# Patient Record
Sex: Female | Born: 1945 | Race: Black or African American | Hispanic: No | Smoking: Former smoker
Health system: Southern US, Community
[De-identification: ages and names within clinical notes are randomized; demographics above are authoritative.]

## PROBLEM LIST (undated history)

## (undated) DIAGNOSIS — M109 Gout, unspecified: Secondary | ICD-10-CM

## (undated) DIAGNOSIS — I10 Essential (primary) hypertension: Secondary | ICD-10-CM

## (undated) DIAGNOSIS — M1990 Unspecified osteoarthritis, unspecified site: Secondary | ICD-10-CM

---

## 1992-09-26 HISTORY — PX: BREAST EXCISIONAL BIOPSY: SUR124

## 2001-11-16 ENCOUNTER — Encounter: Payer: Self-pay | Admitting: Family Medicine

## 2001-11-16 ENCOUNTER — Ambulatory Visit (HOSPITAL_COMMUNITY): Admission: RE | Admit: 2001-11-16 | Discharge: 2001-11-16 | Payer: Self-pay | Admitting: Family Medicine

## 2002-02-28 ENCOUNTER — Emergency Department (HOSPITAL_COMMUNITY): Admission: EM | Admit: 2002-02-28 | Discharge: 2002-02-28 | Payer: Self-pay | Admitting: Emergency Medicine

## 2002-02-28 ENCOUNTER — Encounter: Payer: Self-pay | Admitting: Emergency Medicine

## 2002-10-15 ENCOUNTER — Emergency Department (HOSPITAL_COMMUNITY): Admission: EM | Admit: 2002-10-15 | Discharge: 2002-10-15 | Payer: Self-pay | Admitting: Emergency Medicine

## 2002-10-15 ENCOUNTER — Encounter: Payer: Self-pay | Admitting: Emergency Medicine

## 2002-11-18 ENCOUNTER — Ambulatory Visit (HOSPITAL_COMMUNITY): Admission: RE | Admit: 2002-11-18 | Discharge: 2002-11-18 | Payer: Self-pay | Admitting: Family Medicine

## 2004-01-23 ENCOUNTER — Emergency Department (HOSPITAL_COMMUNITY): Admission: EM | Admit: 2004-01-23 | Discharge: 2004-01-23 | Payer: Self-pay | Admitting: *Deleted

## 2004-03-23 ENCOUNTER — Emergency Department (HOSPITAL_COMMUNITY): Admission: EM | Admit: 2004-03-23 | Discharge: 2004-03-23 | Payer: Self-pay | Admitting: Family Medicine

## 2004-03-29 ENCOUNTER — Emergency Department (HOSPITAL_COMMUNITY): Admission: EM | Admit: 2004-03-29 | Discharge: 2004-03-29 | Payer: Self-pay | Admitting: Emergency Medicine

## 2005-04-23 ENCOUNTER — Emergency Department (HOSPITAL_COMMUNITY): Admission: EM | Admit: 2005-04-23 | Discharge: 2005-04-24 | Payer: Self-pay | Admitting: *Deleted

## 2005-06-12 ENCOUNTER — Emergency Department (HOSPITAL_COMMUNITY): Admission: EM | Admit: 2005-06-12 | Discharge: 2005-06-12 | Payer: Self-pay | Admitting: Emergency Medicine

## 2005-11-09 ENCOUNTER — Ambulatory Visit (HOSPITAL_COMMUNITY): Admission: RE | Admit: 2005-11-09 | Discharge: 2005-11-09 | Payer: Self-pay | Admitting: Family Medicine

## 2006-07-25 ENCOUNTER — Emergency Department (HOSPITAL_COMMUNITY): Admission: EM | Admit: 2006-07-25 | Discharge: 2006-07-26 | Payer: Self-pay | Admitting: Emergency Medicine

## 2006-07-31 ENCOUNTER — Ambulatory Visit: Payer: Self-pay | Admitting: Orthopedic Surgery

## 2007-01-30 ENCOUNTER — Emergency Department (HOSPITAL_COMMUNITY): Admission: EM | Admit: 2007-01-30 | Discharge: 2007-01-30 | Payer: Self-pay | Admitting: Emergency Medicine

## 2008-01-31 ENCOUNTER — Ambulatory Visit (HOSPITAL_COMMUNITY): Admission: RE | Admit: 2008-01-31 | Discharge: 2008-01-31 | Payer: Self-pay | Admitting: Family Medicine

## 2009-01-16 ENCOUNTER — Emergency Department (HOSPITAL_COMMUNITY): Admission: EM | Admit: 2009-01-16 | Discharge: 2009-01-16 | Payer: Self-pay | Admitting: Emergency Medicine

## 2011-01-05 LAB — URINALYSIS, ROUTINE W REFLEX MICROSCOPIC
Bilirubin Urine: NEGATIVE
Glucose, UA: NEGATIVE mg/dL
Hgb urine dipstick: NEGATIVE
Nitrite: NEGATIVE
Specific Gravity, Urine: 1.02 (ref 1.005–1.030)
Urobilinogen, UA: 0.2 mg/dL (ref 0.0–1.0)
pH: 5 (ref 5.0–8.0)

## 2011-02-16 ENCOUNTER — Emergency Department (HOSPITAL_COMMUNITY)
Admission: EM | Admit: 2011-02-16 | Discharge: 2011-02-17 | Disposition: A | Discharge status: Home / Self Care | Payer: Medicare Other | Attending: Emergency Medicine | Admitting: Emergency Medicine

## 2011-02-16 DIAGNOSIS — M109 Gout, unspecified: Secondary | ICD-10-CM | POA: Insufficient documentation

## 2011-02-16 DIAGNOSIS — I10 Essential (primary) hypertension: Secondary | ICD-10-CM | POA: Insufficient documentation

## 2011-02-16 DIAGNOSIS — E039 Hypothyroidism, unspecified: Secondary | ICD-10-CM | POA: Insufficient documentation

## 2011-02-16 DIAGNOSIS — M25579 Pain in unspecified ankle and joints of unspecified foot: Secondary | ICD-10-CM | POA: Insufficient documentation

## 2011-02-16 DIAGNOSIS — E119 Type 2 diabetes mellitus without complications: Secondary | ICD-10-CM | POA: Insufficient documentation

## 2011-07-02 ENCOUNTER — Emergency Department (HOSPITAL_COMMUNITY): Payer: Medicare Other

## 2011-07-02 ENCOUNTER — Emergency Department (HOSPITAL_COMMUNITY)
Admission: EM | Admit: 2011-07-02 | Discharge: 2011-07-02 | Disposition: A | Discharge status: Home / Self Care | Payer: Medicare Other | Attending: Emergency Medicine | Admitting: Emergency Medicine

## 2011-07-02 DIAGNOSIS — R209 Unspecified disturbances of skin sensation: Secondary | ICD-10-CM | POA: Insufficient documentation

## 2011-07-02 DIAGNOSIS — M25539 Pain in unspecified wrist: Secondary | ICD-10-CM | POA: Insufficient documentation

## 2011-07-02 DIAGNOSIS — M65839 Other synovitis and tenosynovitis, unspecified forearm: Secondary | ICD-10-CM | POA: Insufficient documentation

## 2011-07-02 DIAGNOSIS — M778 Other enthesopathies, not elsewhere classified: Secondary | ICD-10-CM

## 2011-07-02 DIAGNOSIS — M65849 Other synovitis and tenosynovitis, unspecified hand: Secondary | ICD-10-CM | POA: Insufficient documentation

## 2011-07-02 HISTORY — DX: Essential (primary) hypertension: I10

## 2011-07-02 HISTORY — DX: Unspecified osteoarthritis, unspecified site: M19.90

## 2011-07-02 MED ORDER — HYDROCODONE-ACETAMINOPHEN 5-325 MG PO TABS
1.0000 | ORAL_TABLET | Freq: Once | ORAL | Status: AC
  Administered 2011-07-02: 1 via ORAL
  Filled 2011-07-02: qty 1

## 2011-07-02 MED ORDER — NAPROXEN 500 MG PO TABS: 500.0000 mg | ORAL_TABLET | Freq: Two times a day (BID) | ORAL | Status: AC

## 2011-07-02 MED ORDER — HYDROCODONE-ACETAMINOPHEN 5-325 MG PO TABS: ORAL_TABLET | ORAL | Status: AC

## 2011-07-02 NOTE — ED Provider Notes (Signed)
History     CSN: [id number] Arrival date & time: 07/02/2011  9:05 PM  No chief complaint on file.   (Consider location/radiation/quality/duration/timing/severity/associated sxs/prior treatment) Patient is a 65 y.o. female presenting with wrist pain. The history is provided by the patient.  Wrist Pain This is a new problem. The current episode started yesterday. The problem occurs constantly. The problem has been unchanged. Associated symptoms include arthralgias and numbness. Pertinent negatives include no chest pain, chills, coughing, diaphoresis, fatigue, fever, joint swelling, myalgias, neck pain, rash, sore throat or weakness. Associated symptoms comments: Numbness and tingling sensation to the right index finger. Exacerbated by: palpation, movement. She has tried nothing for the symptoms. The treatment provided no relief.    Past Medical History  Diagnosis Date  . Diabetes mellitus   . Hypertension   . Arthritis     History reviewed. No pertinent past surgical history.  History reviewed. No pertinent family history.  History  Substance Use Topics  . Smoking status: Never Smoker   . Smokeless tobacco: Not on file  . Alcohol Use: No    OB History    Grav Para Term Preterm Abortions TAB SAB Ect Mult Living                  Review of Systems  Constitutional: Negative for fever, chills, diaphoresis and fatigue.  HENT: Negative for sore throat, trouble swallowing, neck pain and neck stiffness.   Respiratory: Negative for cough, shortness of breath and wheezing.   Cardiovascular: Negative for chest pain and palpitations.  Musculoskeletal: Positive for arthralgias. Negative for myalgias, back pain and joint swelling.  Skin: Negative.  Negative for rash.  Neurological: Positive for numbness. Negative for dizziness and weakness.  Hematological: Does not bruise/bleed easily.  All other systems reviewed and are negative.    Allergies  Review of patient's allergies  indicates no known allergies.  Home Medications  No current outpatient prescriptions on file.  BP 183/77  Pulse 80  Temp 98.4 F (36.9 C)  Resp 22  SpO2 100%  Physical Exam  Nursing note and vitals reviewed. Constitutional: She is oriented to person, place, and time. She appears well-developed and well-nourished. No distress.  HENT:  Head: Normocephalic and atraumatic.  Mouth/Throat: Oropharynx is clear and moist.  Neck: Normal range of motion. Neck supple.  Cardiovascular: Normal rate, regular rhythm and normal heart sounds.   Pulmonary/Chest: Effort normal and breath sounds normal. No respiratory distress. She exhibits no tenderness.  Musculoskeletal: She exhibits tenderness. She exhibits no edema.       Right wrist: She exhibits decreased range of motion and tenderness. She exhibits no swelling, no effusion, no crepitus, no deformity and no laceration.       ttp of the medial right wrist.  No edema or erythema.  CR < 2sec, radial pulse is strong and equal bilaterally.  Sensation intact.    Lymphadenopathy:    She has no cervical adenopathy.  Neurological: She is alert and oriented to person, place, and time. No cranial nerve deficit. She exhibits normal muscle tone. Coordination normal.  Skin: Skin is warm and dry.    ED Course  ORTHOPEDIC INJURY TREATMENT Date/Time: 07/02/2011 9:58 PM Performed by: Trisha Mangle, Sadler Teschner L. Authorized by: Benny Lennert Consent: Verbal consent obtained. Written consent not obtained. Risks and benefits: risks, benefits and alternatives were discussed Consent given by: patient Patient understanding: patient states understanding of the procedure being performed Patient consent: the patient's understanding of the procedure matches consent given  Procedure consent: procedure consent matches procedure scheduled Imaging studies: imaging studies available Patient identity confirmed: verbally with patient Time out: Immediately prior to procedure a  "time out" was called to verify the correct patient, procedure, equipment, support staff and site/side marked as required. Injury location: wrist Injury type: soft tissue Pre-procedure neurovascular assessment: neurovascularly intact Pre-procedure distal perfusion: normal Pre-procedure neurological function: normal Pre-procedure range of motion: reduced Local anesthesia used: no Patient sedated: no Immobilization: splint and brace Post-procedure neurovascular assessment: post-procedure neurovascularly intact Post-procedure distal perfusion: normal Post-procedure neurological function: normal Post-procedure range of motion: unchanged Patient tolerance: Patient tolerated the procedure well with no immediate complications.   (including critical care time)      Dg Wrist Complete Right  07/02/2011  *RADIOLOGY REPORT*  Clinical Data: Right wrist pain.  RIGHT WRIST - COMPLETE 3+ VIEW  Comparison: 07/26/2006  Findings: Degenerative changes of the first carpal metacarpal joint. No acute bony abnormality.  Specifically, no fracture, subluxation, or dislocation.  Soft tissues are intact.  IMPRESSION: No acute bony abnormality.  Original Report Authenticated By: Cyndie Chime, M.D.      MDM     Pain improved after application of the velcro wrist splint.  Patient likely has tendonitis.  She agrees to follow-up with orthopedics if the pain is not improving     Eithen Castiglia L. Anacarolina Evelyn, [place] 07/02/11 2210

## 2011-07-02 NOTE — ED Notes (Signed)
Right arm and wrist pain since yesterday, denies any known injury

## 2011-07-02 NOTE — ED Notes (Signed)
Complaining of R arm and wrist pain x 1 day. Denies injury. NAD noted.

## 2011-07-03 NOTE — ED Provider Notes (Signed)
Medical screening examination/treatment/procedure(s) were performed by non-physician practitioner and as supervising physician I was immediately available for consultation/collaboration.   Tabetha Haraway L Janisha Bueso, MD 07/03/11 2235 

## 2011-10-24 ENCOUNTER — Encounter: Payer: Self-pay | Admitting: Internal Medicine

## 2011-11-21 ENCOUNTER — Other Ambulatory Visit: Payer: Medicare Other | Admitting: Internal Medicine

## 2012-10-03 ENCOUNTER — Other Ambulatory Visit (HOSPITAL_COMMUNITY): Payer: Self-pay | Admitting: Family Medicine

## 2012-10-03 DIAGNOSIS — Z139 Encounter for screening, unspecified: Secondary | ICD-10-CM

## 2012-10-09 ENCOUNTER — Ambulatory Visit (HOSPITAL_COMMUNITY)
Admission: RE | Admit: 2012-10-09 | Discharge: 2012-10-09 | Disposition: A | Discharge status: Home / Self Care | Payer: Medicare Other | Source: Ambulatory Visit | Attending: Family Medicine | Admitting: Family Medicine

## 2012-10-09 DIAGNOSIS — Z139 Encounter for screening, unspecified: Secondary | ICD-10-CM

## 2012-10-09 DIAGNOSIS — Z1231 Encounter for screening mammogram for malignant neoplasm of breast: Secondary | ICD-10-CM | POA: Insufficient documentation

## 2013-11-25 ENCOUNTER — Encounter (HOSPITAL_COMMUNITY): Payer: Self-pay | Admitting: Emergency Medicine

## 2013-11-25 ENCOUNTER — Emergency Department (HOSPITAL_COMMUNITY)
Admission: EM | Admit: 2013-11-25 | Discharge: 2013-11-25 | Disposition: A | Discharge status: Home / Self Care | Payer: Medicare Other | Attending: Emergency Medicine | Admitting: Emergency Medicine

## 2013-11-25 DIAGNOSIS — Z87891 Personal history of nicotine dependence: Secondary | ICD-10-CM | POA: Insufficient documentation

## 2013-11-25 DIAGNOSIS — M19031 Primary osteoarthritis, right wrist: Secondary | ICD-10-CM

## 2013-11-25 DIAGNOSIS — M109 Gout, unspecified: Secondary | ICD-10-CM | POA: Insufficient documentation

## 2013-11-25 DIAGNOSIS — E119 Type 2 diabetes mellitus without complications: Secondary | ICD-10-CM | POA: Insufficient documentation

## 2013-11-25 DIAGNOSIS — M129 Arthropathy, unspecified: Secondary | ICD-10-CM | POA: Insufficient documentation

## 2013-11-25 DIAGNOSIS — M19039 Primary osteoarthritis, unspecified wrist: Secondary | ICD-10-CM | POA: Insufficient documentation

## 2013-11-25 DIAGNOSIS — Z79899 Other long term (current) drug therapy: Secondary | ICD-10-CM | POA: Insufficient documentation

## 2013-11-25 HISTORY — DX: Gout, unspecified: M10.9

## 2013-11-25 MED ORDER — HYDROCODONE-ACETAMINOPHEN 5-325 MG PO TABS
1.0000 | ORAL_TABLET | Freq: Once | ORAL | Status: AC
  Administered 2013-11-25: 1 via ORAL
  Filled 2013-11-25: qty 1

## 2013-11-25 MED ORDER — ONDANSETRON HCL 8 MG PO TABS
4.0000 mg | ORAL_TABLET | Freq: Once | ORAL | Status: AC
  Administered 2013-11-25: 4 mg via ORAL
  Filled 2013-11-25: qty 1

## 2013-11-25 MED ORDER — HYDROCODONE-ACETAMINOPHEN 5-325 MG PO TABS: 1.0000 | ORAL_TABLET | ORAL | Status: DC | PRN

## 2013-11-25 MED ORDER — PREDNISONE 10 MG PO TABS: ORAL_TABLET | ORAL | Status: DC

## 2013-11-25 MED ORDER — PREDNISONE 20 MG PO TABS
60.0000 mg | ORAL_TABLET | Freq: Once | ORAL | Status: AC
  Administered 2013-11-25: 60 mg via ORAL
  Filled 2013-11-25: qty 3

## 2013-11-25 NOTE — ED Notes (Signed)
Pt is here with right wrist pain, swelling and some redness, no injury.  Pt states history of gout

## 2013-11-25 NOTE — Discharge Instructions (Signed)
Your examination is consistent with acute exacerbation of your degenerative joint disease. Please keep your wrist warm. Please continue your indomethacin. Please add prednisone taper, and Norco for pain. Norco may cause drowsiness, please use with caution. Please monitor your glucose carefully while the prednisone. Please see your primary physician as sone as possible for additional followup and evaluation. Osteoarthritis Osteoarthritis is a disease that causes soreness and swelling (inflammation) of a joint. It occurs when the cartilage at the affected joint wears down. Cartilage acts as a cushion, covering the ends of bones where they meet to form a joint. Osteoarthritis is the most common form of arthritis. It often occurs in older people. The joints affected most often by this condition include those in the:  Ends of the fingers.  Thumbs.  Neck.  Lower back.  Knees.  Hips. CAUSES  Over time, the cartilage that covers the ends of bones begins to wear away. This causes bone to rub on bone, producing pain and stiffness in the affected joints.  RISK FACTORS Certain factors can increase your chances of having osteoarthritis, including:  Older age.  Excessive body weight.  Overuse of joints. SIGNS AND SYMPTOMS   Pain, swelling, and stiffness in the joint.  Over time, the joint may lose its normal shape.  Small deposits of bone (osteophytes) may grow on the edges of the joint.  Bits of bone or cartilage can break off and float inside the joint space. This may cause more pain and damage. DIAGNOSIS  Your health care provider will do a physical exam and ask about your symptoms. Various tests may be ordered, such as:  X-rays of the affected joint.  An MRI scan.  Blood tests to rule out other types of arthritis.  Joint fluid tests. This involves using a needle to draw fluid from the joint and examining the fluid under a microscope. TREATMENT  Goals of treatment are to control  pain and improve joint function. Treatment plans may include:  A prescribed exercise program that allows for rest and joint relief.  A weight control plan.  Pain relief techniques, such as:  Properly applied heat and cold.  Electric pulses delivered to nerve endings under the skin (transcutaneous electrical nerve stimulation, TENS).  Massage.  Certain nutritional supplements.  Medicines to control pain, such as:  Acetaminophen.  Nonsteroidal anti-inflammatory drugs (NSAIDs), such as naproxen.  Narcotic or central-acting agents, such as tramadol.  Corticosteroids. These can be given orally or as an injection.  Surgery to reposition the bones and relieve pain (osteotomy) or to remove loose pieces of bone and cartilage. Joint replacement may be needed in advanced states of osteoarthritis. HOME CARE INSTRUCTIONS   Only take over-the-counter or prescription medicines as directed by your health care provider. Take all medicines exactly as instructed.  Maintain a healthy weight. Follow your health care provider's instructions for weight control. This may include dietary instructions.  Exercise as directed. Your health care provider can recommend specific types of exercise. These may include:  Strengthening exercises These are done to strengthen the muscles that support joints affected by arthritis. They can be performed with weights or with exercise bands to add resistance.  Aerobic activities These are exercises, such as brisk walking or low-impact aerobics, that get your heart pumping.  Range-of-motion activities These keep your joints limber.  Balance and agility exercises These help you maintain daily living skills.  Rest your affected joints as directed by your health care provider.  Follow up with your health care  provider as directed. SEEK MEDICAL CARE IF:   Your skin turns red.  You develop a rash in addition to your joint pain.  You have worsening joint  pain. SEEK IMMEDIATE MEDICAL CARE IF:  You have a significant loss of weight or appetite.  You have a fever along with joint or muscle aches.  You have night sweats. FOR MORE INFORMATION  National Institute of Arthritis and Musculoskeletal and Skin Diseases: www.niams.http://www.myers.net/nih.gov [employer]: https://walker.com/www.nia.nih.gov American College of Rheumatology: www.rheumatology.org Document Released: 09/12/2005 Document Revised: 07/03/2013 Document Reviewed: 05/20/2013 [facility] Patient Information 2014 [employer], [place].

## 2013-11-25 NOTE — ED Provider Notes (Signed)
CSN: [id number]     Arrival date & time 11/25/13  1027 History   None    Chief Complaint  Patient presents with  . Wrist Pain     (Consider location/radiation/quality/duration/timing/severity/associated sxs/prior Treatment) HPI Comments: Patient is a 68 year old female who presents to the emergency department with complaint of pain and redness of the right wrist. The patient states that this is been going on for quite some time, but seems mostly to come and go. Most recently this has started about 2-3 days ago. His been no known injury reported. Patient has a history of gout, states she's not sure this is a gout attack or an arthritis attack. She has been taking indomethacin, but states she's only gotten minimally relieved from this.  Patient is a 68 y.o. female presenting with wrist pain. The history is provided by the patient.  Wrist Pain This is a recurrent problem. The current episode started yesterday. Associated symptoms include arthralgias. Pertinent negatives include no abdominal pain, chest pain, coughing or neck pain.    Past Medical History  Diagnosis Date  . Diabetes mellitus   . Hypertension   . Arthritis   . Gout    History reviewed. No pertinent past surgical history. No family history on file. History  Substance Use Topics  . Smoking status: Former Games developer  . Smokeless tobacco: Not on file  . Alcohol Use: No   OB History   Grav Para Term Preterm Abortions TAB SAB Ect Mult Living                 Review of Systems  Constitutional: Negative for activity change.       All ROS Neg except as noted in HPI  HENT: Negative for nosebleeds.   Eyes: Negative for photophobia and discharge.  Respiratory: Negative for cough, shortness of breath and wheezing.   Cardiovascular: Negative for chest pain and palpitations.  Gastrointestinal: Negative for abdominal pain and blood in stool.  Genitourinary: Negative for dysuria, frequency and hematuria.  Musculoskeletal: Positive  for arthralgias. Negative for back pain and neck pain.  Skin: Negative.   Neurological: Negative for dizziness, seizures and speech difficulty.  Psychiatric/Behavioral: Negative for hallucinations and confusion.      Allergies  Review of patient's allergies indicates no known allergies.  Home Medications   Current Outpatient Rx  Name  Route  Sig  Dispense  Refill  . allopurinol (ZYLOPRIM) 100 MG tablet   Oral   Take 100 mg by mouth daily.         Marland Kitchen atenolol-chlorthalidone (TENORETIC) 100-25 MG per tablet   Oral   Take 1 tablet by mouth daily.         Marland Kitchen glipiZIDE (GLUCOTROL) 10 MG tablet   Oral   Take 10 mg by mouth 2 (two) times daily before a meal.         . indomethacin (INDOCIN) 25 MG capsule   Oral   Take 25 mg by mouth 3 (three) times daily as needed for mild pain.         Marland Kitchen levothyroxine (SYNTHROID, LEVOTHROID) 50 MCG tablet   Oral   Take 50 mcg by mouth daily before breakfast.         . metFORMIN (GLUCOPHAGE) 1000 MG tablet   Oral   Take 1,000 mg by mouth 2 (two) times daily with a meal.         . pravastatin (PRAVACHOL) 40 MG tablet   Oral   Take 40 mg by  mouth daily.          BP 168/64  Pulse 81  Temp(Src) 97.7 F (36.5 C) (Oral)  Resp 18  Wt 260 lb (117.935 kg)  SpO2 98% Physical Exam  Nursing note and vitals reviewed. Constitutional: She is oriented to person, place, and time. She appears well-developed and well-nourished.  Non-toxic appearance.  HENT:  Head: Normocephalic.  Right Ear: Tympanic membrane and external ear normal.  Left Ear: Tympanic membrane and external ear normal.  Eyes: EOM and lids are normal. Pupils are equal, round, and reactive to light.  Neck: Normal range of motion. Neck supple. Carotid bruit is not present.  Cardiovascular: Normal rate, regular rhythm, normal heart sounds, intact distal pulses and normal pulses.   Pulmonary/Chest: Breath sounds normal. No respiratory distress.  Abdominal: Soft. Bowel  sounds are normal. There is no tenderness. There is no guarding.  Musculoskeletal: Normal range of motion.  There is good range of motion of the right shoulder, but with some crepitus present. There is soreness with range of motion of the right elbow. The elbow is not hot. There is pain, swelling, and redness involving the right wrist. There degenerative changes involving the metacarpophalangeal joints of both hands.  Lymphadenopathy:       Head (right side): No submandibular adenopathy present.       Head (left side): No submandibular adenopathy present.    She has no cervical adenopathy.  Neurological: She is alert and oriented to person, place, and time. She has normal strength. No cranial nerve deficit or sensory deficit.  Skin: Skin is warm and dry.  Psychiatric: She has a normal mood and affect. Her speech is normal.    ED Course  Procedures (including critical care time) Labs Review Labs Reviewed - No data to display Imaging Review No results found.   EKG Interpretation None      MDM Patient has a history of arthritis and gout. Feels that she is having an exacerbation of the same. No hot joints appreciated. Vital signs are stable. Pulse oximetry is 98% on room air. Within normal limits by my interpretation.  The plan at this time is for the patient to continue her indomethacin. Will add Norco and prednisone. Patient has been cautioned to monitor glucose very carefully. She is to follow with her primary physician if not improving.    Final diagnoses:  None    **I have reviewed nursing notes, vital signs, and all appropriate lab and imaging results for this pt.  Kathie DikeHobson M Anieya Helman, PA-C 11/25/13 1208

## 2013-11-26 NOTE — ED Provider Notes (Signed)
Medical screening examination/treatment/procedure(s) were performed by non-physician practitioner and as supervising physician I was immediately available for consultation/collaboration.   EKG Interpretation None        Vernor Monnig E Jaxen Samples, MD 11/26/13 0738 

## 2013-12-26 ENCOUNTER — Other Ambulatory Visit (HOSPITAL_COMMUNITY): Payer: Self-pay | Admitting: Physician Assistant

## 2013-12-26 DIAGNOSIS — Z1231 Encounter for screening mammogram for malignant neoplasm of breast: Secondary | ICD-10-CM

## 2013-12-31 ENCOUNTER — Ambulatory Visit (HOSPITAL_COMMUNITY)
Admission: RE | Admit: 2013-12-31 | Discharge: 2013-12-31 | Disposition: A | Discharge status: Home / Self Care | Payer: Medicare Other | Source: Ambulatory Visit | Attending: Physician Assistant | Admitting: Physician Assistant

## 2013-12-31 DIAGNOSIS — Z1231 Encounter for screening mammogram for malignant neoplasm of breast: Secondary | ICD-10-CM | POA: Insufficient documentation

## 2014-02-08 ENCOUNTER — Encounter (HOSPITAL_COMMUNITY): Payer: Self-pay | Admitting: Emergency Medicine

## 2014-02-08 ENCOUNTER — Emergency Department (HOSPITAL_COMMUNITY)
Admission: EM | Admit: 2014-02-08 | Discharge: 2014-02-08 | Disposition: A | Discharge status: Home / Self Care | Payer: Medicare Other | Attending: Emergency Medicine | Admitting: Emergency Medicine

## 2014-02-08 DIAGNOSIS — Z79899 Other long term (current) drug therapy: Secondary | ICD-10-CM | POA: Insufficient documentation

## 2014-02-08 DIAGNOSIS — M129 Arthropathy, unspecified: Secondary | ICD-10-CM | POA: Insufficient documentation

## 2014-02-08 DIAGNOSIS — E119 Type 2 diabetes mellitus without complications: Secondary | ICD-10-CM | POA: Insufficient documentation

## 2014-02-08 DIAGNOSIS — Z791 Long term (current) use of non-steroidal anti-inflammatories (NSAID): Secondary | ICD-10-CM | POA: Insufficient documentation

## 2014-02-08 DIAGNOSIS — I10 Essential (primary) hypertension: Secondary | ICD-10-CM | POA: Insufficient documentation

## 2014-02-08 DIAGNOSIS — M549 Dorsalgia, unspecified: Secondary | ICD-10-CM | POA: Insufficient documentation

## 2014-02-08 DIAGNOSIS — Z87891 Personal history of nicotine dependence: Secondary | ICD-10-CM | POA: Insufficient documentation

## 2014-02-08 LAB — URINALYSIS, ROUTINE W REFLEX MICROSCOPIC
Bilirubin Urine: NEGATIVE
GLUCOSE, UA: NEGATIVE mg/dL
HGB URINE DIPSTICK: NEGATIVE
KETONES UR: NEGATIVE mg/dL
LEUKOCYTES UA: NEGATIVE
NITRITE: NEGATIVE
PH: 5 (ref 5.0–8.0)
Protein, ur: NEGATIVE mg/dL
SPECIFIC GRAVITY, URINE: 1.018 (ref 1.005–1.030)
UROBILINOGEN UA: 1 mg/dL (ref 0.0–1.0)

## 2014-02-08 MED ORDER — ONDANSETRON 4 MG PO TBDP
8.0000 mg | ORAL_TABLET | Freq: Once | ORAL | Status: AC
  Administered 2014-02-08: 8 mg via ORAL
  Filled 2014-02-08: qty 2

## 2014-02-08 MED ORDER — HYDROCODONE-ACETAMINOPHEN 5-325 MG PO TABS
2.0000 | ORAL_TABLET | Freq: Once | ORAL | Status: AC
  Administered 2014-02-08: 2 via ORAL
  Filled 2014-02-08: qty 2

## 2014-02-08 MED ORDER — HYDROCODONE-ACETAMINOPHEN 5-325 MG PO TABS: 1.0000 | ORAL_TABLET | Freq: Four times a day (QID) | ORAL | Status: DC | PRN

## 2014-02-08 MED ORDER — ONDANSETRON HCL 4 MG PO TABS: 4.0000 mg | ORAL_TABLET | Freq: Four times a day (QID) | ORAL | Status: DC

## 2014-02-08 NOTE — ED Notes (Signed)
PT reports pain to upper back since last week.

## 2014-02-08 NOTE — ED Provider Notes (Signed)
CSN: [id number]     Arrival date & time 02/08/14  [id number] History  This chart was scribed for non-physician practitioner Junious SilkHannah Payal Stanforth, PA-C working with Layla MawKristen N Ward, DO by Dorothey Basemania Sutton, ED Scribe. This patient was seen in room TR07C/TR07C and the patient's care was started at 9:17 AM.    Chief Complaint  Patient presents with  . Back Pain   The history is provided by the patient and medical records. No language interpreter was used.   HPI Comments: Kayla Moyer is a 68 y.o. female who presents to the Emergency Department complaining of a waxing and waning pain to the mid back onset about a week ago. She states that the pain started on the left side, but has since moved to the right side. Patient denies any pain radiation into the legs. She states that the pain is worse at night with laying down, but that using a pillow for support is able to provide some relief. Patient denies any potential injury or trauma to the area. Patient reports some nausea a few days ago, but denies any currently. She denies fever, chills, vomiting, diarrhea, dysuria, urinary frequency, bowel or bladder incontinence, chest pain, shortness of breath. Patient reports that she has not had a normal menstrual period for 8-10 years. Patient has a history of DM, HTN, arthritis, and gout.   Past Medical History  Diagnosis Date  . Diabetes mellitus   . Hypertension   . Arthritis   . Gout    No past surgical history on file. No family history on file. History  Substance Use Topics  . Smoking status: Former Games developermoker  . Smokeless tobacco: Not on file  . Alcohol Use: No   OB History   Grav Para Term Preterm Abortions TAB SAB Ect Mult Living                 Review of Systems  Constitutional: Negative for fever and chills.  Respiratory: Negative for shortness of breath.   Cardiovascular: Negative for chest pain.  Gastrointestinal: Positive for nausea (resolved). Negative for vomiting and diarrhea.  Genitourinary:  Negative for dysuria and frequency.  Musculoskeletal: Positive for back pain.  All other systems reviewed and are negative.     Allergies  Review of patient's allergies indicates no known allergies.  Home Medications   Prior to Admission medications   Medication Sig Start Date End Date Taking? Authorizing Provider  allopurinol (ZYLOPRIM) 100 MG tablet Take 100 mg by mouth daily.    Historical Provider, MD  atenolol-chlorthalidone (TENORETIC) 100-25 MG per tablet Take 1 tablet by mouth daily.    Historical Provider, MD  glipiZIDE (GLUCOTROL) 10 MG tablet Take 10 mg by mouth 2 (two) times daily before a meal.    Historical Provider, MD  HYDROcodone-acetaminophen (NORCO/VICODIN) 5-325 MG per tablet Take 1 tablet by mouth every 4 (four) hours as needed. 11/25/13   Kathie DikeHobson M Bryant, PA-C  indomethacin (INDOCIN) 25 MG capsule Take 25 mg by mouth 3 (three) times daily as needed for mild pain.    Historical Provider, MD  levothyroxine (SYNTHROID, LEVOTHROID) 50 MCG tablet Take 50 mcg by mouth daily before breakfast.    Historical Provider, MD  metFORMIN (GLUCOPHAGE) 1000 MG tablet Take 1,000 mg by mouth 2 (two) times daily with a meal.    Historical Provider, MD  pravastatin (PRAVACHOL) 40 MG tablet Take 40 mg by mouth daily.    Historical Provider, MD  predniSONE (DELTASONE) 10 MG tablet 5,4,3,2,1 - take with  food 11/25/13   Kathie DikeHobson M Bryant, PA-C   Triage Vitals: BP 175/61  Pulse 88  Temp(Src) 99.1 F (37.3 C) (Oral)  Resp 20  SpO2 100%  Physical Exam  Nursing note and vitals reviewed. Constitutional: She is oriented to person, place, and time. She appears well-developed and well-nourished. No distress.  HENT:  Head: Normocephalic and atraumatic.  Right Ear: External ear normal.  Left Ear: External ear normal.  Nose: Nose normal.  Mouth/Throat: Oropharynx is clear and moist.  Eyes: Conjunctivae are normal.  Neck: Normal range of motion.  Cardiovascular: Normal rate, regular rhythm and  normal heart sounds.   Pulses:      Dorsalis pedis pulses are 2+ on the right side, and 2+ on the left side.       Posterior tibial pulses are 2+ on the right side, and 2+ on the left side.  Pulmonary/Chest: Effort normal and breath sounds normal. No stridor. No respiratory distress. She has no wheezes. She has no rales.  Abdominal: Soft. She exhibits no distension.  Musculoskeletal: Normal range of motion.       Arms: Tenderness to palpation to the right, mid back. No tenderness along the spine. No deformities or step-offs.   Neurological: She is alert and oriented to person, place, and time. She has normal strength.  Normal gait.   Skin: Skin is warm and dry. She is not diaphoretic. No erythema.  Psychiatric: She has a normal mood and affect. Her behavior is normal.    ED Course  Procedures (including critical care time)  DIAGNOSTIC STUDIES: Oxygen Saturation is 100% on room air, normal by my interpretation.    COORDINATION OF CARE: 9:22 AM- Ordered Vicodin and Zofran to manage symptoms. Will order UA, urine culture. Will consult with Dr. Elesa MassedWard. Discussed treatment plan with patient at bedside and patient verbalized agreement.   9:55 AM- Dr. Elesa MassedWard is with patient at bedside. Independently reviewed preliminary lab results, which were normal. Will discharge patient with pain medication to manage symptoms. Discussed treatment plan with patient at bedside and patient verbalized agreement.   Labs Review Labs Reviewed  URINE CULTURE  URINALYSIS, ROUTINE W REFLEX MICROSCOPIC   Imaging Review No results found.   EKG Interpretation None      MDM   Final diagnoses:  Back pain    Patient with back pain.  No neurological deficits and normal neuro exam.  Patient can walk but states is painful.  No loss of bowel or bladder control.  No concern for cauda equina.  No fever, night sweats, weight loss, h/o cancer, IVDU.  RICE protocol and pain medicine indicated and discussed with patient.     I personally performed the services described in this documentation, which was scribed in my presence. The recorded information has been reviewed and is accurate.     Mora BellmanHannah S Shloime Keilman, PA-C 02/14/14 1510

## 2014-02-08 NOTE — ED Provider Notes (Signed)
Medical screening examination/treatment/procedure(s) were conducted as a shared visit with non-physician practitioner(s) and myself.  I personally evaluated the patient during the encounter.   EKG Interpretation None      Pt is a 68 y.o. F with history of diabetes, hypertension who presents emergency department with right-sided lateral back pain that was present for the past week. No history of injury. No numbness, focal weakness, bowel or bladder incontinence, urinary retention. She has noticed some possible hematuria. No fevers, vomiting or diarrhea. On exam, patient is tender to palpation over her right flank with no midline spinal tenderness or step-off or deformity. She is neurologically intact. Normal gait. Urine shows no blood or sign of infection. Suspect this is musculoskeletal pain. We'll discharge her with pain medication, return precautions and supportive care instructions.  Layla MawKristen N Ward, DO 02/08/14 1007

## 2014-02-08 NOTE — Discharge Instructions (Signed)
Back Pain, Adult Low back pain is very common. About 1 in 5 people have back pain.The cause of low back pain is rarely dangerous. The pain often gets better over time.About half of people with a sudden onset of back pain feel better in just 2 weeks. About 8 in 10 people feel better by 6 weeks.  CAUSES Some common causes of back pain include:  Strain of the muscles or ligaments supporting the spine.  Wear and tear (degeneration) of the spinal discs.  Arthritis.  Direct injury to the back. DIAGNOSIS Most of the time, the direct cause of low back pain is not known.However, back pain can be treated effectively even when the exact cause of the pain is unknown.Answering your caregiver's questions about your overall health and symptoms is one of the most accurate ways to make sure the cause of your pain is not dangerous. If your caregiver needs more information, he or she may order lab work or imaging tests (X-rays or MRIs).However, even if imaging tests show changes in your back, this usually does not require surgery. HOME CARE INSTRUCTIONS For many people, back pain returns.Since low back pain is rarely dangerous, it is often a condition that people can learn to manageon their own.   Remain active. It is stressful on the back to sit or stand in one place. Do not sit, drive, or stand in one place for more than 30 minutes at a time. Take short walks on level surfaces as soon as pain allows.Try to increase the length of time you walk each day.  Do not stay in bed.Resting more than 1 or 2 days can delay your recovery.  Do not avoid exercise or work.Your body is made to move.It is not dangerous to be active, even though your back may hurt.Your back will likely heal faster if you return to being active before your pain is gone.  Pay attention to your body when you bend and lift. Many people have less discomfortwhen lifting if they bend their knees, keep the load close to their bodies,and  avoid twisting. Often, the most comfortable positions are those that put less stress on your recovering back.  Find a comfortable position to sleep. Use a firm mattress and lie on your side with your knees slightly bent. If you lie on your back, put a pillow under your knees.  Only take over-the-counter or prescription medicines as directed by your caregiver. Over-the-counter medicines to reduce pain and inflammation are often the most helpful.Your caregiver may prescribe muscle relaxant drugs.These medicines help dull your pain so you can more quickly return to your normal activities and healthy exercise.  Put ice on the injured area.  Put ice in a plastic bag.  Place a towel between your skin and the bag.  Leave the ice on for 15-20 minutes, 03-04 times a day for the first 2 to 3 days. After that, ice and heat may be alternated to reduce pain and spasms.  Ask your caregiver about trying back exercises and gentle massage. This may be of some benefit.  Avoid feeling anxious or stressed.Stress increases muscle tension and can worsen back pain.It is important to recognize when you are anxious or stressed and learn ways to manage it.Exercise is a great option. SEEK MEDICAL CARE IF:  You have pain that is not relieved with rest or medicine.  You have pain that does not improve in 1 week.  You have new symptoms.  You are generally not feeling well. SEEK   IMMEDIATE MEDICAL CARE IF:   You have pain that radiates from your back into your legs.  You develop new bowel or bladder control problems.  You have unusual weakness or numbness in your arms or legs.  You develop nausea or vomiting.  You develop abdominal pain.  You feel faint. Document Released: 09/12/2005 Document Revised: 03/13/2012 Document Reviewed: 01/31/2011 [employer] Patient Information 2014 [employer].  

## 2014-02-10 LAB — URINE CULTURE: Colony Count: 15000

## 2014-02-17 NOTE — ED Provider Notes (Signed)
Medical screening examination/treatment/procedure(s) were performed by non-physician practitioner and as supervising physician I was immediately available for consultation/collaboration.   EKG Interpretation None        Adaja Wander N Marine Lezotte, DO 02/17/14 1658 

## 2014-11-28 ENCOUNTER — Other Ambulatory Visit (HOSPITAL_COMMUNITY): Payer: Self-pay | Admitting: Family Medicine

## 2014-11-28 DIAGNOSIS — Z1231 Encounter for screening mammogram for malignant neoplasm of breast: Secondary | ICD-10-CM

## 2015-01-05 ENCOUNTER — Ambulatory Visit (HOSPITAL_COMMUNITY)
Admission: RE | Admit: 2015-01-05 | Discharge: 2015-01-05 | Disposition: A | Discharge status: Home / Self Care | Payer: Medicare Other | Source: Ambulatory Visit | Attending: Family Medicine | Admitting: Family Medicine

## 2015-01-05 DIAGNOSIS — Z1231 Encounter for screening mammogram for malignant neoplasm of breast: Secondary | ICD-10-CM | POA: Insufficient documentation

## 2015-01-08 ENCOUNTER — Other Ambulatory Visit: Payer: Self-pay | Admitting: Family Medicine

## 2015-01-08 DIAGNOSIS — R928 Other abnormal and inconclusive findings on diagnostic imaging of breast: Secondary | ICD-10-CM

## 2015-01-12 ENCOUNTER — Other Ambulatory Visit: Payer: Self-pay | Admitting: Physician Assistant

## 2015-01-12 DIAGNOSIS — R928 Other abnormal and inconclusive findings on diagnostic imaging of breast: Secondary | ICD-10-CM

## 2015-01-13 ENCOUNTER — Ambulatory Visit
Admission: RE | Admit: 2015-01-13 | Discharge: 2015-01-13 | Disposition: A | Discharge status: Home / Self Care | Payer: Medicare Other | Source: Ambulatory Visit | Attending: Family Medicine | Admitting: Family Medicine

## 2015-01-13 DIAGNOSIS — R928 Other abnormal and inconclusive findings on diagnostic imaging of breast: Secondary | ICD-10-CM

## 2015-01-16 ENCOUNTER — Other Ambulatory Visit: Payer: Self-pay | Admitting: Family Medicine

## 2015-01-16 DIAGNOSIS — R921 Mammographic calcification found on diagnostic imaging of breast: Secondary | ICD-10-CM

## 2015-03-25 ENCOUNTER — Emergency Department (HOSPITAL_COMMUNITY)
Admission: EM | Admit: 2015-03-25 | Discharge: 2015-03-25 | Disposition: A | Discharge status: Home / Self Care | Payer: Medicare Other | Attending: Emergency Medicine | Admitting: Emergency Medicine

## 2015-03-25 ENCOUNTER — Encounter (HOSPITAL_COMMUNITY): Payer: Self-pay | Admitting: Emergency Medicine

## 2015-03-25 ENCOUNTER — Emergency Department (HOSPITAL_COMMUNITY): Payer: Medicare Other

## 2015-03-25 DIAGNOSIS — E119 Type 2 diabetes mellitus without complications: Secondary | ICD-10-CM | POA: Diagnosis not present

## 2015-03-25 DIAGNOSIS — M109 Gout, unspecified: Secondary | ICD-10-CM | POA: Insufficient documentation

## 2015-03-25 DIAGNOSIS — Z87891 Personal history of nicotine dependence: Secondary | ICD-10-CM | POA: Insufficient documentation

## 2015-03-25 DIAGNOSIS — M1990 Unspecified osteoarthritis, unspecified site: Secondary | ICD-10-CM | POA: Diagnosis not present

## 2015-03-25 DIAGNOSIS — Z79899 Other long term (current) drug therapy: Secondary | ICD-10-CM | POA: Insufficient documentation

## 2015-03-25 DIAGNOSIS — M79642 Pain in left hand: Secondary | ICD-10-CM | POA: Diagnosis present

## 2015-03-25 DIAGNOSIS — I10 Essential (primary) hypertension: Secondary | ICD-10-CM | POA: Insufficient documentation

## 2015-03-25 MED ORDER — IBUPROFEN 400 MG PO TABS: 400.0000 mg | ORAL_TABLET | Freq: Three times a day (TID) | ORAL | Status: DC

## 2015-03-25 NOTE — Progress Notes (Signed)
Orthopedic Tech Progress Note Patient Details:  Kayla Moyer 05/14/1946 [id number]  Ortho Devices Type of Ortho Device: Finger splint Ortho Device/Splint Location: LUE Index Ortho Device/Splint Interventions: Application   Asia R Thompson 03/25/2015, 11:28 PM

## 2015-03-25 NOTE — ED Notes (Signed)
Pt stable, ambulatory, states understanding of discharge instructions 

## 2015-03-25 NOTE — ED Provider Notes (Signed)
CSN: [id number]     Arrival date & time 03/25/15  2054 History   This chart was scribed for Santiago GladHeather Marquail Bradwell, PA-C working with Mancel BaleElliott Wentz, MD by Elveria Risingimelie Horne, ED Scribe. This patient was seen in room TR10C/TR10C and the patient's care was started at 10:05 PM.   Chief Complaint  Patient presents with  . Hand Pain   The history is provided by the patient. No language interpreter was used.   HPI Comments: Kayla Moyer is a 69 y.o. female with PMHx including arthritis and gout who presents to the Emergency Department complaining of left hand pain and swelling located primary to second MCP joint, onset upon wakening one morning last week. Patient reports attempted treatment with her arthritis and gout medications without relief. Patient denies history of gout in her hand; instead she states it presents in her left great toe. Patient denies recent consumption of red meat or alcohol. Patient denies fever, chills, numbness, or tingling in left hand.    Past Medical History  Diagnosis Date  . Diabetes mellitus   . Hypertension   . Arthritis   . Gout    History reviewed. No pertinent past surgical history. No family history on file. History  Substance Use Topics  . Smoking status: Former Games developermoker  . Smokeless tobacco: Not on file  . Alcohol Use: No   OB History    No data available     Review of Systems  Constitutional: Negative for fever and chills.  Musculoskeletal: Positive for joint swelling and arthralgias.       Hand pain and swelling      Allergies  Review of patient's allergies indicates no known allergies.  Home Medications   Prior to Admission medications   Medication Sig Start Date End Date Taking? Authorizing Provider  acetaminophen (TYLENOL) 650 MG CR tablet Take 650 mg by mouth every 8 (eight) hours as needed for pain.    Historical Provider, MD  allopurinol (ZYLOPRIM) 100 MG tablet Take 100 mg by mouth daily.    Historical Provider, MD  atenolol-chlorthalidone  (TENORETIC) 100-25 MG per tablet Take 1 tablet by mouth daily.    Historical Provider, MD  glipiZIDE (GLUCOTROL) 10 MG tablet Take 10 mg by mouth 2 (two) times daily before a meal.    Historical Provider, MD  HYDROcodone-acetaminophen (NORCO/VICODIN) 5-325 MG per tablet Take 1-2 tablets by mouth every 6 (six) hours as needed. 02/08/14   Junious SilkHannah Merrell, PA-C  levothyroxine (SYNTHROID, LEVOTHROID) 50 MCG tablet Take 50 mcg by mouth daily before breakfast.    Historical Provider, MD  metFORMIN (GLUCOPHAGE) 1000 MG tablet Take 1,000 mg by mouth 2 (two) times daily with a meal.    Historical Provider, MD  ondansetron (ZOFRAN) 4 MG tablet Take 1 tablet (4 mg total) by mouth every 6 (six) hours. 02/08/14   Junious SilkHannah Merrell, PA-C  pravastatin (PRAVACHOL) 40 MG tablet Take 40 mg by mouth daily.    Historical Provider, MD   Triage Vitals: BP 163/89 mmHg  Pulse 79  Temp(Src) 98.1 F (36.7 C) (Oral)  Resp 16  Ht 5\' 3"  (1.6 m)  Wt 248 lb (112.492 kg)  BMI 43.94 kg/m2  SpO2 96% Physical Exam  Constitutional: She is oriented to person, place, and time. She appears well-developed and well-nourished. No distress.  HENT:  Head: Normocephalic and atraumatic.  Eyes: EOM are normal.  Neck: Neck supple. No tracheal deviation present.  Cardiovascular: Normal rate, regular rhythm and normal heart sounds.   Pulmonary/Chest: Effort  normal and breath sounds normal. No respiratory distress.  Musculoskeletal: Normal range of motion.  Mild diffuse swelling of dorsal of left hand. No erythema or wamth. Tenderness to second MCP. Movment of left index mildly limited at the level of the MCP secondary to pain.  Neurological: She is alert and oriented to person, place, and time.    Distal sensation of all fingers intact. Good cap refill.   Skin: Skin is warm and dry.  Psychiatric: She has a normal mood and affect. Her behavior is normal.  Nursing note and vitals reviewed.   ED Course  Procedures (including critical care  time)  COORDINATION OF CARE: 10:11 PM- Discussed treatment plan with patient at bedside and patient agreed to plan.   Labs Review Labs Reviewed - No data to display  Imaging Review Dg Hand Complete Left  03/25/2015   CLINICAL DATA:  Left lateral hand pain starting last night, no known injury  EXAM: LEFT HAND - COMPLETE 3+ VIEW  COMPARISON:  None.  FINDINGS: Three views of the left hand submitted. No acute fracture or subluxation. Degenerative changes are noted distal interphalangeal joints. Degenerative changes first carpometacarpal joint. Degenerative changes proximal interphalangeal joint fourth and fifth finger. Narrowing of radiocarpal joint space. Mild soft tissue swelling dorsal metacarpal region.  IMPRESSION: No acute fracture or subluxation. Degenerative changes as described above   Electronically Signed   By: Natasha Mead M.D.   On: 03/25/2015 22:27     EKG Interpretation None      MDM   Final diagnoses:  None  Patient presents today with pain of the left 2nd MCP onset one week ago.  No acute injury or trauma.  Xray showing degenerative changes, but not in the area of pain.  Patient afebrile.  No signs of a septic joint.  Patient given Rx for Ibuprofen.  Finger splinted and patient given referral to Hand Surgery.  Patient also evaluated by Dr. Effie Shy who is in agreement with the plan.  Return precautions given.    I personally performed the services described in this documentation, which was scribed in my presence. The recorded information has been reviewed and is accurate.    Santiago Glad, PA-C 03/25/15 2317  Mancel Bale, MD 03/26/15 [phone]

## 2015-03-25 NOTE — ED Notes (Signed)
Pt. reports left hand pain with swelling onset last Monday , denies injury .

## 2015-03-25 NOTE — ED Provider Notes (Signed)
  Face-to-face evaluation   History: Left hand pain for one week without trauma. Her PCP. Recommended Ace wrap, after seeing her. No improvement.  Physical exam: Alert, calm, cooperative. Left hand with localized tenderness and swelling at the second and see PCP joint. With decreased range of motion noted to pain  Assessment- nonspecific hand joint pain with swelling consistent with a nonspecific arthritis. Gout septic joint or tendinitis.   Radiologic imaging report reviewed and images by radiography  - viewed, by me.  Medical screening examination/treatment/procedure(s) were conducted as a shared visit with non-physician practitioner(s) and myself.  I personally evaluated the patient during the encounter  Mancel BaleElliott Kemoni Ortega, MD 03/26/15 [phone]

## 2015-06-30 ENCOUNTER — Other Ambulatory Visit: Payer: Self-pay | Admitting: Family Medicine

## 2015-06-30 ENCOUNTER — Ambulatory Visit
Admission: RE | Admit: 2015-06-30 | Discharge: 2015-06-30 | Disposition: A | Discharge status: Home / Self Care | Payer: Medicare Other | Source: Ambulatory Visit | Attending: Family Medicine | Admitting: Family Medicine

## 2015-06-30 DIAGNOSIS — R921 Mammographic calcification found on diagnostic imaging of breast: Secondary | ICD-10-CM

## 2015-12-11 ENCOUNTER — Other Ambulatory Visit: Payer: Self-pay

## 2015-12-11 DIAGNOSIS — Z1231 Encounter for screening mammogram for malignant neoplasm of breast: Secondary | ICD-10-CM

## 2015-12-28 ENCOUNTER — Other Ambulatory Visit: Payer: Self-pay | Admitting: Family Medicine

## 2015-12-28 DIAGNOSIS — R921 Mammographic calcification found on diagnostic imaging of breast: Secondary | ICD-10-CM

## 2016-01-06 ENCOUNTER — Ambulatory Visit
Admission: RE | Admit: 2016-01-06 | Discharge: 2016-01-06 | Disposition: A | Discharge status: Home / Self Care | Payer: Medicare Other | Source: Ambulatory Visit | Attending: Family Medicine | Admitting: Family Medicine

## 2016-01-06 DIAGNOSIS — R921 Mammographic calcification found on diagnostic imaging of breast: Secondary | ICD-10-CM

## 2016-11-14 ENCOUNTER — Telehealth: Payer: Self-pay

## 2016-11-28 NOTE — Telephone Encounter (Signed)
Gastroenterology Pre-Procedure Review  Request Date: 11/14/2016 Requesting Physician: [facility]   PATIENT REVIEW QUESTIONS: The patient responded to the following health history questions as indicated:     1. Diabetes Melitis: YES 2. Joint replacements in the past 12 months: no 3. Major health problems in the past 3 months: no 4. Has an artificial valve or MVP: no 5. Has a defibrillator: no 6. Has been advised in past to take antibiotics in advance of a procedure like teeth cleaning: no 7. Family history of colon cancer: no  8. Alcohol Use: no 9. History of sleep apnea: no  10. History of coronary artery or other vascular stents placed within the last 12 months: no    MEDICATIONS & ALLERGIES:    Patient reports the following regarding taking any blood thinners:   Plavix? no Aspirin? no Coumadin? no Brilinta? no Xarelto? no Eliquis? no Pradaxa? no Savaysa? no Effient? no  Patient confirms/reports the following medications:  Current Outpatient Prescriptions  Medication Sig Dispense Refill  . allopurinol (ZYLOPRIM) 100 MG tablet Take 100 mg by mouth daily.    Marland Kitchen. amLODipine (NORVASC) 10 MG tablet Take 10 mg by mouth daily.    Marland Kitchen. atenolol (TENORMIN) 100 MG tablet Take 100 mg by mouth daily.    . chlorthalidone (HYGROTON) 25 MG tablet Take 25 mg by mouth daily.    . colchicine 0.6 MG tablet Take 0.6 mg by mouth daily as needed.    Marland Kitchen. glipiZIDE (GLUCOTROL) 10 MG tablet Take 10 mg by mouth 2 (two) times daily before a meal.    . levothyroxine (SYNTHROID, LEVOTHROID) 50 MCG tablet Take 50 mcg by mouth daily before breakfast.    . losartan (COZAAR) 25 MG tablet Take 25 mg by mouth daily.    . pioglitazone (ACTOS) 30 MG tablet Take 30 mg by mouth daily.    . pravastatin (PRAVACHOL) 40 MG tablet Take 20 mg by mouth daily.     . Vitamin D, Ergocalciferol, (DRISDOL) 50000 units CAPS capsule Take 50,000 Units by mouth every 7 (seven) days.    Marland Kitchen. acetaminophen (TYLENOL) 650 MG CR tablet  Take 650 mg by mouth every 8 (eight) hours as needed for pain.    Marland Kitchen. HYDROcodone-acetaminophen (NORCO/VICODIN) 5-325 MG per tablet Take 1-2 tablets by mouth every 6 (six) hours as needed. (Patient not taking: Reported on 11/14/2016) 20 tablet 0  . ibuprofen (ADVIL,MOTRIN) 400 MG tablet Take 1 tablet (400 mg total) by mouth 3 (three) times daily. (Patient not taking: Reported on 11/14/2016) 21 tablet 0  . metFORMIN (GLUCOPHAGE) 1000 MG tablet Take 1,000 mg by mouth 2 (two) times daily with a meal.    . ondansetron (ZOFRAN) 4 MG tablet Take 1 tablet (4 mg total) by mouth every 6 (six) hours. (Patient not taking: Reported on 11/14/2016) 12 tablet 0   No current facility-administered medications for this visit.     Patient confirms/reports the following allergies:  Allergies  Allergen Reactions  . Lisinopril Cough    No orders of the defined types were placed in this encounter.   AUTHORIZATION INFORMATION Primary Insurance:  ID #:  Group #:  Pre-Cert / Auth required:  Pre-Cert / Auth #:   Secondary Insurance:  ID #:   Group #:  Pre-Cert / Auth required:  Pre-Cert / Auth #:   SCHEDULE INFORMATION: Procedure has been scheduled as follows:  Date:  Time:   Location:   This Gastroenterology Pre-Precedure Review Form is being routed to the following provider(s): R. Roetta Sessions, MD

## 2016-11-29 ENCOUNTER — Other Ambulatory Visit: Payer: Self-pay

## 2016-11-29 DIAGNOSIS — Z1211 Encounter for screening for malignant neoplasm of colon: Secondary | ICD-10-CM

## 2016-11-29 MED ORDER — PEG 3350-KCL-NA BICARB-NACL 420 G PO SOLR: 4000.0000 mL | ORAL | 0 refills | Status: AC

## 2016-11-29 NOTE — Telephone Encounter (Signed)
Appropriate. No diabetes medications day of procedure.  

## 2016-11-29 NOTE — Telephone Encounter (Signed)
Pt has been scheduled for 01/11/2017 at 9:30 AM with Dr. Jena Gaussourk. Rx sent to the pharmacy and instructions mailed to pt.

## 2016-12-20 ENCOUNTER — Other Ambulatory Visit: Payer: Self-pay | Admitting: Physician Assistant

## 2016-12-20 DIAGNOSIS — Z1231 Encounter for screening mammogram for malignant neoplasm of breast: Secondary | ICD-10-CM

## 2017-01-09 NOTE — Telephone Encounter (Signed)
PT called to cancel her appt for colonoscopy on Wed 01/11/2017. She has gout and said sometimes it lasts longer than at other times and she will call and reschedule when she can. Eber Jones is aware in endo.

## 2017-01-09 NOTE — Telephone Encounter (Signed)
Noted  

## 2017-01-11 ENCOUNTER — Ambulatory Visit: Payer: Medicare Other

## 2017-01-11 ENCOUNTER — Ambulatory Visit (HOSPITAL_COMMUNITY): Admission: RE | Admit: 2017-01-11 | Payer: Medicare Other | Source: Ambulatory Visit | Admitting: Internal Medicine

## 2017-01-11 ENCOUNTER — Encounter (HOSPITAL_COMMUNITY): Admission: RE | Payer: Self-pay | Source: Ambulatory Visit

## 2017-01-11 SURGERY — COLONOSCOPY
Anesthesia: Moderate Sedation

## 2017-02-15 ENCOUNTER — Ambulatory Visit
Admission: RE | Admit: 2017-02-15 | Discharge: 2017-02-15 | Disposition: A | Discharge status: Home / Self Care | Payer: Medicare Other | Source: Ambulatory Visit | Attending: Physician Assistant | Admitting: Physician Assistant

## 2017-02-15 DIAGNOSIS — Z1231 Encounter for screening mammogram for malignant neoplasm of breast: Secondary | ICD-10-CM

## 2018-02-13 ENCOUNTER — Other Ambulatory Visit: Payer: Self-pay | Admitting: Internal Medicine

## 2018-02-13 DIAGNOSIS — M8580 Other specified disorders of bone density and structure, unspecified site: Secondary | ICD-10-CM

## 2018-02-13 DIAGNOSIS — Z1231 Encounter for screening mammogram for malignant neoplasm of breast: Secondary | ICD-10-CM

## 2018-03-27 ENCOUNTER — Ambulatory Visit
Admission: RE | Admit: 2018-03-27 | Discharge: 2018-03-27 | Disposition: A | Discharge status: Home / Self Care | Payer: Medicare Other | Source: Ambulatory Visit | Attending: Internal Medicine | Admitting: Internal Medicine

## 2018-03-27 DIAGNOSIS — Z1231 Encounter for screening mammogram for malignant neoplasm of breast: Secondary | ICD-10-CM

## 2018-03-27 DIAGNOSIS — M8580 Other specified disorders of bone density and structure, unspecified site: Secondary | ICD-10-CM

## 2020-01-15 ENCOUNTER — Other Ambulatory Visit: Payer: Self-pay | Admitting: Internal Medicine

## 2020-01-15 DIAGNOSIS — Z1231 Encounter for screening mammogram for malignant neoplasm of breast: Secondary | ICD-10-CM

## 2020-03-03 ENCOUNTER — Ambulatory Visit: Payer: Medicare Other

## 2020-04-10 ENCOUNTER — Emergency Department (HOSPITAL_COMMUNITY)
Admission: EM | Admit: 2020-04-10 | Discharge: 2020-04-11 | Disposition: A | Discharge status: Home / Self Care | Payer: Medicare Other | Attending: Emergency Medicine | Admitting: Emergency Medicine

## 2020-04-10 ENCOUNTER — Other Ambulatory Visit: Payer: Self-pay

## 2020-04-10 ENCOUNTER — Encounter (HOSPITAL_COMMUNITY): Payer: Self-pay

## 2020-04-10 DIAGNOSIS — E119 Type 2 diabetes mellitus without complications: Secondary | ICD-10-CM | POA: Diagnosis not present

## 2020-04-10 DIAGNOSIS — I10 Essential (primary) hypertension: Secondary | ICD-10-CM | POA: Diagnosis not present

## 2020-04-10 DIAGNOSIS — M109 Gout, unspecified: Secondary | ICD-10-CM | POA: Diagnosis not present

## 2020-04-10 DIAGNOSIS — M25562 Pain in left knee: Secondary | ICD-10-CM | POA: Insufficient documentation

## 2020-04-10 DIAGNOSIS — Z87891 Personal history of nicotine dependence: Secondary | ICD-10-CM | POA: Insufficient documentation

## 2020-04-10 NOTE — ED Triage Notes (Signed)
Pt arrives to ED w/ c/o BLE weakness and pain that started 3 days ago. Pt reports 7/10 pain.

## 2020-04-11 ENCOUNTER — Emergency Department (HOSPITAL_COMMUNITY): Payer: Medicare Other

## 2020-04-11 LAB — CBC
HCT: 42 % (ref 36.0–46.0)
Hemoglobin: 12.5 g/dL (ref 12.0–15.0)
MCH: 25.7 pg — ABNORMAL LOW (ref 26.0–34.0)
MCHC: 29.8 g/dL — ABNORMAL LOW (ref 30.0–36.0)
MCV: 86.2 fL (ref 80.0–100.0)
Platelets: 260 10*3/uL (ref 150–400)
RBC: 4.87 MIL/uL (ref 3.87–5.11)
RDW: 14.1 % (ref 11.5–15.5)
WBC: 6.6 10*3/uL (ref 4.0–10.5)
nRBC: 0 % (ref 0.0–0.2)

## 2020-04-11 LAB — BASIC METABOLIC PANEL
Anion gap: 13 (ref 5–15)
BUN: 33 mg/dL — ABNORMAL HIGH (ref 8–23)
CO2: 25 mmol/L (ref 22–32)
Calcium: 9.3 mg/dL (ref 8.9–10.3)
Chloride: 103 mmol/L (ref 98–111)
Creatinine, Ser: 1.36 mg/dL — ABNORMAL HIGH (ref 0.44–1.00)
GFR calc Af Amer: 44 mL/min — ABNORMAL LOW (ref >60)
GFR calc non Af Amer: 38 mL/min — ABNORMAL LOW (ref >60)
Glucose, Bld: 119 mg/dL — ABNORMAL HIGH (ref 70–99)
Potassium: 3.8 mmol/L (ref 3.5–5.1)
Sodium: 141 mmol/L (ref 135–145)

## 2020-04-11 MED ORDER — OXYCODONE-ACETAMINOPHEN 5-325 MG PO TABS
2.0000 | ORAL_TABLET | Freq: Once | ORAL | Status: AC
  Administered 2020-04-11: 2 via ORAL
  Filled 2020-04-11: qty 2

## 2020-04-11 MED ORDER — METHYLPREDNISOLONE SODIUM SUCC 125 MG IJ SOLR
125.0000 mg | Freq: Once | INTRAMUSCULAR | Status: AC
  Administered 2020-04-11: 125 mg via INTRAMUSCULAR
  Filled 2020-04-11: qty 2

## 2020-04-11 MED ORDER — OXYCODONE-ACETAMINOPHEN 5-325 MG PO TABS: 2.0000 | ORAL_TABLET | Freq: Four times a day (QID) | ORAL | 0 refills | Status: AC | PRN

## 2020-04-11 MED ORDER — COLCHICINE 0.6 MG PO TABS: 0.6000 mg | ORAL_TABLET | Freq: Every day | ORAL | 0 refills | Status: AC | PRN

## 2020-04-11 NOTE — ED Provider Notes (Signed)
Emergency Department Provider Note  I have reviewed the triage vital signs and the nursing notes.  HISTORY  Chief Complaint Leg Pain   HPI Kayla Moyer is a 74 y.o. female with medical problems document below who presents to the emergency department today secondary to knee pain.  Patient states she has left greater than right knee pain.  Has been that way since she woke up a few days ago after an in a chair for couple days because she was tired.  Has a history of gout and feels similar but she is never had in her knee.  She is not had any fevers or recent illnesses.  She does have some pain with range of motion and pain with movement.   No other associated or modifying symptoms.    Past Medical History:  Diagnosis Date  . Arthritis   . Diabetes mellitus   . Gout   . Hypertension     There are no problems to display for this patient.   Past Surgical History:  Procedure Laterality Date  . BREAST EXCISIONAL BIOPSY Left 1994    Current Outpatient Rx  . Order #: [id number] Class: Historical Med  . Order #: [id number] Class: Historical Med  . Order #: [id number] Class: Historical Med  . Order #: [id number] Class: Historical Med  . Order #: [id number] Class: Historical Med  . Order #: [id number] Class: Historical Med  . Order #: [id number] Class: Historical Med  . Order #: [id number] Class: Normal  . Order #: [id number] Class: Print  . Order #: [id number] Class: Normal    Allergies Lisinopril  No family history on file.  Social History Social History   Tobacco Use  . Smoking status: Former Smoker  Substance Use Topics  . Alcohol use: No  . Drug use: No    Review of Systems  All other systems negative except as documented in the HPI. All pertinent positives and negatives as reviewed in the HPI. ____________________________________________  PHYSICAL EXAM:  VITAL SIGNS: ED Triage Vitals  Enc Vitals Group     BP 04/10/20 1838 (!) 182/93     Pulse Rate 04/10/20 1838 70      Resp 04/10/20 1838 18     Temp 04/10/20 1838 98.2 F (36.8 C)     Temp src --      SpO2 04/10/20 1838 98 %    Constitutional: Alert and oriented. Well appearing and in no acute distress. Eyes: Conjunctivae are normal. PERRL. EOMI. Head: Atraumatic. Nose: No congestion/rhinnorhea. Mouth/Throat: Mucous membranes are moist.  Oropharynx non-erythematous. Neck: No stridor.  No meningeal signs.   Cardiovascular: Normal rate, regular rhythm. Good peripheral circulation. Grossly normal heart sounds.   Respiratory: Normal respiratory effort.  No retractions. Lungs CTAB. Gastrointestinal: Soft and nontender. No distention.  Musculoskeletal: Left knee is slightly warm and edematous.  Has pain with range of motion in that knee as well. Neurologic:  Normal speech and language. No gross focal neurologic deficits are appreciated.  Skin:  Skin is warm, dry and intact. No rash noted.  ____________________________________________   LABS (all labs ordered are listed, but only abnormal results are displayed)  Labs Reviewed  CBC - Abnormal; Notable for the following components:      Result Value   MCH 25.7 (*)    MCHC 29.8 (*)    All other components within normal limits  BASIC METABOLIC PANEL - Abnormal; Notable for the following components:   Glucose, Bld 119 (*)    BUN 33 (*)    Creatinine, Ser 1.36 (*)  GFR calc non Af Amer 38 (*)    GFR calc Af Amer 44 (*)    All other components within normal limits   ____________________________________________   RADIOLOGY  DG Knee 2 Views Left  Result Date: 04/11/2020 CLINICAL DATA:  Knee pain x3 days. EXAM: LEFT KNEE - 1-2 VIEW COMPARISON:  None. FINDINGS: No evidence of acute fracture or dislocation. Moderate to marked severity medial and lateral tibiofemoral compartment space narrowing is seen. A small joint effusion is noted. IMPRESSION: Moderate to marked severity degenerative changes with a small joint effusion. Electronically Signed   By:  Aram Candela M.D.   On: 04/11/2020 03:57   ____________________________________________  PROCEDURES  Procedure(s) performed:   Procedures ____________________________________________  INITIAL IMPRESSION / ASSESSMENT AND PLAN / ED COURSE   This patient presents to the ED for concern of knee pain, this involves an extensive number of treatment options, and is a complaint that carries with it a high risk of complications and morbidity.  The differential diagnosis includes gout, septic arthritis, traumatic arthritis     Lab Tests:   I Ordered, reviewed, and interpreted labs, which included CBC and BMP which without significant abnormalities  Medicines ordered:   I ordered medication prednisone and Percocet for pain  Imaging Studies ordered:   I independently visualized and interpreted imaging knee x-ray which showed no obvious abnormalities  Additional history obtained:   Additional history obtained from no one  Previous records obtained and reviewed in epic  Consultations Obtained:   I consulted no one and discussed lab and imaging findings  Reevaluation:  After the interventions stated above, I reevaluated the patient and found improved pain.  I suspect that this is gouty arthritis.  Without leukocytosis, fever, recent illness or other infectious symptoms of the is unlikely be a septic arthritis.  Has diabetes but also has mild kidney disease.  She has colchicine at home which she can take.  I will hold on prednisone at this point to give her a prescription for the Percocet as well.  Will return here if any new symptoms such as fever worsening pain or worsening swelling.  Daughter can help her a little bit at home she states.  A medical screening exam was performed and I feel the patient has had an appropriate workup for their chief complaint at this time and likelihood of emergent condition existing is low. They have been counseled on decision, discharge, follow  up and which symptoms necessitate immediate return to the emergency department. They or their family verbally stated understanding and agreement with plan and discharged in stable condition.   ____________________________________________  FINAL CLINICAL IMPRESSION(S) / ED DIAGNOSES  Final diagnoses:  Acute pain of left knee    MEDICATIONS GIVEN DURING THIS VISIT:  Medications  oxyCODONE-acetaminophen (PERCOCET/ROXICET) 5-325 MG per tablet 2 tablet (2 tablets Oral Given 04/11/20 0257)  methylPREDNISolone sodium succinate (SOLU-MEDROL) 125 mg/2 mL injection 125 mg (125 mg Intramuscular Given 04/11/20 0424)    NEW OUTPATIENT MEDICATIONS STARTED DURING THIS VISIT:  Discharge Medication List as of 04/11/2020  4:37 AM    START taking these medications   Details  oxyCODONE-acetaminophen (PERCOCET) 5-325 MG tablet Take 2 tablets by mouth every 6 (six) hours as needed for severe pain., Starting Sat 04/11/2020, Print        Note:  This note was prepared with assistance of Dragon voice recognition software. Occasional wrong-word or sound-a-like substitutions may have occurred due to the inherent limitations of voice recognition software.   Jenny Omdahl,  Barbara Cower, MD 04/11/20 [phone]

## 2020-04-11 NOTE — ED Notes (Signed)
Discharge instructions discussed with pt. Pt verbalized understanding. Pt stable and ambulatory. No signature pad available. 

## 2020-08-02 ENCOUNTER — Emergency Department (HOSPITAL_COMMUNITY): Payer: Medicare Other

## 2020-08-02 ENCOUNTER — Encounter (HOSPITAL_COMMUNITY): Payer: Self-pay | Admitting: Emergency Medicine

## 2020-08-02 ENCOUNTER — Emergency Department (HOSPITAL_COMMUNITY)
Admission: EM | Admit: 2020-08-02 | Discharge: 2020-08-02 | Disposition: A | Discharge status: Home / Self Care | Payer: Medicare Other | Attending: Emergency Medicine | Admitting: Emergency Medicine

## 2020-08-02 ENCOUNTER — Other Ambulatory Visit: Payer: Self-pay

## 2020-08-02 DIAGNOSIS — E119 Type 2 diabetes mellitus without complications: Secondary | ICD-10-CM | POA: Insufficient documentation

## 2020-08-02 DIAGNOSIS — I10 Essential (primary) hypertension: Secondary | ICD-10-CM | POA: Diagnosis not present

## 2020-08-02 DIAGNOSIS — M549 Dorsalgia, unspecified: Secondary | ICD-10-CM | POA: Insufficient documentation

## 2020-08-02 DIAGNOSIS — M25512 Pain in left shoulder: Secondary | ICD-10-CM

## 2020-08-02 DIAGNOSIS — R519 Headache, unspecified: Secondary | ICD-10-CM | POA: Diagnosis not present

## 2020-08-02 DIAGNOSIS — Z87891 Personal history of nicotine dependence: Secondary | ICD-10-CM | POA: Insufficient documentation

## 2020-08-02 DIAGNOSIS — M542 Cervicalgia: Secondary | ICD-10-CM | POA: Insufficient documentation

## 2020-08-02 DIAGNOSIS — Z7984 Long term (current) use of oral hypoglycemic drugs: Secondary | ICD-10-CM | POA: Insufficient documentation

## 2020-08-02 DIAGNOSIS — Z79899 Other long term (current) drug therapy: Secondary | ICD-10-CM | POA: Diagnosis not present

## 2020-08-02 LAB — COMPREHENSIVE METABOLIC PANEL
ALT: 10 U/L (ref 0–44)
AST: 15 U/L (ref 15–41)
Albumin: 3.3 g/dL — ABNORMAL LOW (ref 3.5–5.0)
Alkaline Phosphatase: 83 U/L (ref 38–126)
Anion gap: 10 (ref 5–15)
BUN: 24 mg/dL — ABNORMAL HIGH (ref 8–23)
CO2: 24 mmol/L (ref 22–32)
Calcium: 9.4 mg/dL (ref 8.9–10.3)
Chloride: 104 mmol/L (ref 98–111)
Creatinine, Ser: 1.43 mg/dL — ABNORMAL HIGH (ref 0.44–1.00)
GFR, Estimated: 38 mL/min — ABNORMAL LOW (ref >60)
Glucose, Bld: 76 mg/dL (ref 70–99)
Potassium: 4.1 mmol/L (ref 3.5–5.1)
Sodium: 138 mmol/L (ref 135–145)
Total Bilirubin: 0.9 mg/dL (ref 0.3–1.2)
Total Protein: 7.3 g/dL (ref 6.5–8.1)

## 2020-08-02 LAB — CBC WITH DIFFERENTIAL/PLATELET
Abs Immature Granulocytes: 0.03 10*3/uL (ref 0.00–0.07)
Basophils Absolute: 0.1 10*3/uL (ref 0.0–0.1)
Basophils Relative: 1 %
Eosinophils Absolute: 0.1 10*3/uL (ref 0.0–0.5)
Eosinophils Relative: 1 %
HCT: 40.6 % (ref 36.0–46.0)
Hemoglobin: 12.3 g/dL (ref 12.0–15.0)
Immature Granulocytes: 0 %
Lymphocytes Relative: 15 %
Lymphs Abs: 1.3 10*3/uL (ref 0.7–4.0)
MCH: 26.3 pg (ref 26.0–34.0)
MCHC: 30.3 g/dL (ref 30.0–36.0)
MCV: 86.8 fL (ref 80.0–100.0)
Monocytes Absolute: 1 10*3/uL (ref 0.1–1.0)
Monocytes Relative: 11 %
Neutro Abs: 6.1 10*3/uL (ref 1.7–7.7)
Neutrophils Relative %: 72 %
Platelets: 208 10*3/uL (ref 150–400)
RBC: 4.68 MIL/uL (ref 3.87–5.11)
RDW: 13.9 % (ref 11.5–15.5)
WBC: 8.5 10*3/uL (ref 4.0–10.5)
nRBC: 0 % (ref 0.0–0.2)

## 2020-08-02 MED ORDER — ACETAMINOPHEN 500 MG PO TABS
1000.0000 mg | ORAL_TABLET | Freq: Once | ORAL | Status: AC
  Administered 2020-08-02: 1000 mg via ORAL
  Filled 2020-08-02: qty 2

## 2020-08-02 MED ORDER — DICLOFENAC SODIUM 1 % EX GEL: 2.0000 g | Freq: Four times a day (QID) | CUTANEOUS | 0 refills | Status: AC

## 2020-08-02 NOTE — ED Notes (Addendum)
Pt back from CT. BP cuff that went w/ pt on her R arm noted to be missing when pt was brought back to room. Replaced cuff and obtained BP.

## 2020-08-02 NOTE — ED Provider Notes (Signed)
[facility] EMERGENCY DEPARTMENT Provider Note   CSN: [id number] Arrival date & time: 08/02/20  1316     History Chief Complaint  Patient presents with  . Headache  . Back Pain    Kayla Moyer is a 74 y.o. female.  The history is provided by the patient.  Headache Pain location:  R parietal and L parietal Quality:  Dull Radiates to:  Does not radiate Severity currently:  5/10 Severity at highest:  8/10 Onset quality:  Gradual Duration:  1 week Timing:  Intermittent Progression:  Waxing and waning Chronicity:  New Similar to prior headaches: no   Context: not activity, not exposure to bright light and not stress   Context comment:  Worse throughout the day and sometimes bad when laying down Relieved by:  None tried Worsened by:  Nothing Ineffective treatments:  None tried Associated symptoms: back pain and neck pain   Associated symptoms: no cough, no diarrhea, no dizziness, no facial pain, no fever, no focal weakness, no hearing loss, no loss of balance, no myalgias, no nausea, no neck stiffness, no photophobia, no URI, no visual change, no vomiting and no weakness   Associated symptoms comment:  Left shoulder blade pain.  All this started about 1 week ago after she kept her great grandson and was carrying him around.  He is appx 40lbs.  Pain in the shoulder worse with movement and certain positions.  No numbness or weakness.  No lower back pain or issues with walking.  No cough, SOB or fever.  No N/V/D or urinary complaints. Risk factors comment:  Htn, diabetes Back Pain Associated symptoms: headaches   Associated symptoms: no fever and no weakness        Past Medical History:  Diagnosis Date  . Arthritis   . Diabetes mellitus   . Gout   . Hypertension     There are no problems to display for this patient.   Past Surgical History:  Procedure Laterality Date  . BREAST EXCISIONAL BIOPSY Left 1994     OB History   No obstetric history  on file.     No family history on file.  Social History   Tobacco Use  . Smoking status: Former Smoker  Substance Use Topics  . Alcohol use: No  . Drug use: No    Home Medications Prior to Admission medications   Medication Sig Start Date End Date Taking? Authorizing Provider  allopurinol (ZYLOPRIM) 100 MG tablet Take 100 mg by mouth daily.    [provider]  amLODipine (NORVASC) 10 MG tablet Take 10 mg by mouth daily.    [provider]  atenolol (TENORMIN) 100 MG tablet Take 100 mg by mouth daily.    [provider]  colchicine 0.6 MG tablet Take 1-2 tablets (0.6-1.2 mg total) by mouth daily as needed (gout). Start with 1.2mg s at first sign of gout flare then the next day if gout persists take 0.6mg  04/11/20   Mesner, Barbara Cower, MD  glipiZIDE (GLUCOTROL) 10 MG tablet Take 10 mg by mouth 2 (two) times daily.     [provider]  levothyroxine (SYNTHROID, LEVOTHROID) 50 MCG tablet Take 50 mcg by mouth daily before breakfast.    [provider]  losartan (COZAAR) 25 MG tablet Take 25 mg by mouth daily.    [provider]  oxyCODONE-acetaminophen (PERCOCET) 5-325 MG tablet Take 2 tablets by mouth every 6 (six) hours as needed for severe pain. 04/11/20   Mesner,  Barbara Cower, MD  polyethylene glycol-electrolytes (TRILYTE) 420 g solution Take 4,000 mLs by mouth as directed. Patient not taking: Reported on 04/11/2020 11/29/16   Corbin Ade, MD  pravastatin (PRAVACHOL) 20 MG tablet Take 20 mg by mouth every evening.    [provider]    Allergies    Lisinopril  Review of Systems   Review of Systems  Constitutional: Negative for fever.  HENT: Negative for hearing loss.   Eyes: Negative for photophobia.  Respiratory: Negative for cough.   Gastrointestinal: Negative for diarrhea, nausea and vomiting.  Musculoskeletal: Positive for back pain and neck pain. Negative for myalgias and neck stiffness.  Neurological: Positive for  headaches. Negative for dizziness, focal weakness, weakness and loss of balance.  All other systems reviewed and are negative.   Physical Exam Updated Vital Signs BP (!) 189/72 (BP Location: Right Arm)   Pulse 63   Temp 98.3 F (36.8 C) (Oral)   Resp 17   SpO2 100%   Physical Exam Vitals and nursing note reviewed.  Constitutional:      General: She is not in acute distress.    Appearance: She is well-developed. She is obese.  HENT:     Head: Normocephalic and atraumatic.     Mouth/Throat:     Mouth: Mucous membranes are moist.  Eyes:     Extraocular Movements: Extraocular movements intact.     Right eye: Normal extraocular motion.     Left eye: Normal extraocular motion.     Pupils: Pupils are equal, round, and reactive to light.     Right eye: Pupil is round and reactive.     Left eye: Pupil is round and reactive.  Neck:     Trachea: Trachea normal.  Cardiovascular:     Rate and Rhythm: Normal rate and regular rhythm.     Heart sounds: Normal heart sounds. No murmur heard.  No friction rub.  Pulmonary:     Effort: Pulmonary effort is normal.     Breath sounds: Normal breath sounds. No wheezing or rales.  Abdominal:     General: Bowel sounds are normal. There is no distension.     Palpations: Abdomen is soft.     Tenderness: There is no abdominal tenderness. There is no guarding or rebound.  Musculoskeletal:        General: Tenderness present.     Left shoulder: Tenderness present. Decreased range of motion. Normal strength. Normal pulse.       Arms:     Cervical back: Normal range of motion and neck supple. No spinous process tenderness or muscular tenderness.     Thoracic back: Normal.     Lumbar back: Normal.     Right lower leg: No edema.     Left lower leg: No edema.     Comments: No edema  Skin:    General: Skin is warm and dry.     Capillary Refill: Capillary refill takes 2 to 3 seconds.     Findings: No rash.  Neurological:     General: No focal  deficit present.     Mental Status: She is alert and oriented to person, place, and time. Mental status is at baseline.     Cranial Nerves: No cranial nerve deficit.     Sensory: No sensory deficit.     Motor: No weakness.     Gait: Gait normal.     Comments: Walks with cane without difficulty  Psychiatric:  Mood and Affect: Mood normal.        Speech: Speech normal.        Behavior: Behavior normal.     ED Results / Procedures / Treatments   Labs (all labs ordered are listed, but only abnormal results are displayed) Labs Reviewed  COMPREHENSIVE METABOLIC PANEL - Abnormal; Notable for the following components:      Result Value   BUN 24 (*)    Creatinine, Ser 1.43 (*)    Albumin 3.3 (*)    GFR, Estimated 38 (*)    All other components within normal limits  CBC WITH DIFFERENTIAL/PLATELET    EKG None  Radiology No results found.  Procedures Procedures (including critical care time)  Medications Ordered in ED Medications  acetaminophen (TYLENOL) tablet 1,000 mg (1,000 mg Oral Given 08/02/20 1432)    ED Course  I have reviewed the triage vital signs and the nursing notes.  Pertinent labs & imaging results that were available during my care of the patient were reviewed by me and considered in my medical decision making (see chart for details).    MDM Rules/Calculators/A&P                          Early female presenting today with complaints of left shoulder pain and headache.  Symptoms have been ongoing for approximately 1 week.  She denies symptoms concerning for infectious etiology such as meningitis or encephalitis.  No focal findings concerning for stroke.  Low suspicion for ACS as patient's pain in her left shoulder blade is reproducible with palpation and movement of the arm.  She has no localized weakness and normal pulses.  She has no systemic symptoms such as chest pain or shortness of breath.  She has no vision changes.  However she does report that the  headache is new and she does not have this regularly.  She is hypertensive here 189/72.  We will do a CT to ensure no evidence of space-occupying lesion as sometimes the headache is worse with lying down.  She has no focal neck tenderness at this time and she was given Tylenol for the pain as she has not had anything for the pain since the headache started.  Plain film of the shoulder pending.  Patient has not been checking her sugars regularly but CBC is within normal limits and CMP with normal BS and Cr at baseline.  Final Clinical Impression(s) / ED Diagnoses Final diagnoses:  None    Rx / DC Orders ED Discharge Orders    None       Gwyneth Sprout, MD 08/02/20 1541

## 2020-08-02 NOTE — ED Notes (Signed)
Reviewed discharge instructions with patient and daughter. Follow-up care reviewed. Patient and daughter verbalized understanding. Patient A&Ox4, VSS, and ambulatory with steady gait upon discharge.  

## 2020-08-02 NOTE — Discharge Instructions (Signed)
Take Tylenol as needed for the shoulder pain.  Also the Voltaren gel may help.

## 2020-08-02 NOTE — ED Triage Notes (Signed)
C/o headache and back pain x 2-3 days.  States she is unable to sleep at night.  Denies urinary symptoms.  No arm drift.  States both arms are sore.

## 2020-08-02 NOTE — ED Notes (Addendum)
Pt transported to CT at this time.

## 2020-10-20 ENCOUNTER — Other Ambulatory Visit: Payer: Self-pay | Admitting: Internal Medicine

## 2020-10-20 DIAGNOSIS — Z0000 Encounter for general adult medical examination without abnormal findings: Secondary | ICD-10-CM

## 2020-11-04 ENCOUNTER — Encounter (HOSPITAL_COMMUNITY): Payer: Self-pay

## 2020-11-04 ENCOUNTER — Emergency Department (HOSPITAL_COMMUNITY): Payer: Medicare Other

## 2020-11-04 ENCOUNTER — Emergency Department (HOSPITAL_COMMUNITY)
Admission: EM | Admit: 2020-11-04 | Discharge: 2020-11-04 | Disposition: A | Discharge status: Home / Self Care | Payer: Medicare Other | Attending: Emergency Medicine | Admitting: Emergency Medicine

## 2020-11-04 DIAGNOSIS — R4182 Altered mental status, unspecified: Secondary | ICD-10-CM | POA: Diagnosis not present

## 2020-11-04 DIAGNOSIS — Z79899 Other long term (current) drug therapy: Secondary | ICD-10-CM | POA: Diagnosis not present

## 2020-11-04 DIAGNOSIS — Z87891 Personal history of nicotine dependence: Secondary | ICD-10-CM | POA: Diagnosis not present

## 2020-11-04 DIAGNOSIS — I10 Essential (primary) hypertension: Secondary | ICD-10-CM | POA: Diagnosis not present

## 2020-11-04 DIAGNOSIS — E162 Hypoglycemia, unspecified: Secondary | ICD-10-CM

## 2020-11-04 DIAGNOSIS — E11649 Type 2 diabetes mellitus with hypoglycemia without coma: Secondary | ICD-10-CM | POA: Insufficient documentation

## 2020-11-04 DIAGNOSIS — Z7984 Long term (current) use of oral hypoglycemic drugs: Secondary | ICD-10-CM | POA: Insufficient documentation

## 2020-11-04 DIAGNOSIS — R531 Weakness: Secondary | ICD-10-CM | POA: Diagnosis present

## 2020-11-04 LAB — BASIC METABOLIC PANEL
Anion gap: 11 (ref 5–15)
BUN: 17 mg/dL (ref 8–23)
CO2: 25 mmol/L (ref 22–32)
Calcium: 9 mg/dL (ref 8.9–10.3)
Chloride: 101 mmol/L (ref 98–111)
Creatinine, Ser: 1.3 mg/dL — ABNORMAL HIGH (ref 0.44–1.00)
GFR, Estimated: 43 mL/min — ABNORMAL LOW (ref >60)
Glucose, Bld: 145 mg/dL — ABNORMAL HIGH (ref 70–99)
Potassium: 3.7 mmol/L (ref 3.5–5.1)
Sodium: 137 mmol/L (ref 135–145)

## 2020-11-04 LAB — URINALYSIS, ROUTINE W REFLEX MICROSCOPIC
Bilirubin Urine: NEGATIVE
Glucose, UA: NEGATIVE mg/dL
Hgb urine dipstick: NEGATIVE
Ketones, ur: NEGATIVE mg/dL
Leukocytes,Ua: NEGATIVE
Nitrite: NEGATIVE
Protein, ur: >=300 mg/dL — AB
Specific Gravity, Urine: 1.014 (ref 1.005–1.030)
pH: 5 (ref 5.0–8.0)

## 2020-11-04 LAB — CBC
HCT: 35.7 % — ABNORMAL LOW (ref 36.0–46.0)
Hemoglobin: 11.1 g/dL — ABNORMAL LOW (ref 12.0–15.0)
MCH: 26.4 pg (ref 26.0–34.0)
MCHC: 31.1 g/dL (ref 30.0–36.0)
MCV: 84.8 fL (ref 80.0–100.0)
Platelets: 210 10*3/uL (ref 150–400)
RBC: 4.21 MIL/uL (ref 3.87–5.11)
RDW: 14.5 % (ref 11.5–15.5)
WBC: 6.2 10*3/uL (ref 4.0–10.5)
nRBC: 0 % (ref 0.0–0.2)

## 2020-11-04 LAB — CBG MONITORING, ED
Glucose-Capillary: 125 mg/dL — ABNORMAL HIGH (ref 70–99)
Glucose-Capillary: 60 mg/dL — ABNORMAL LOW (ref 70–99)
Glucose-Capillary: 104 mg/dL — ABNORMAL HIGH (ref 70–99)
Glucose-Capillary: 116 mg/dL — ABNORMAL HIGH (ref 70–99)

## 2020-11-04 NOTE — ED Notes (Signed)
Patient transported to CT 

## 2020-11-04 NOTE — Discharge Instructions (Signed)
You need to make sure you are eating today and having snacks.  You do need to talk with your regular doctor as they may need to adjust some of your diabetes medicine.  All your blood work and scans looked okay today.  We have ordered home health to come and evaluate to try to help so that you do not have as many falls.  Also we have given you referral to neurology so they can evaluate you.

## 2020-11-04 NOTE — ED Notes (Signed)
Pt's CBG was 60. Dr Anitra Lauth made aware. Pt given orange juice and graham crackers. Will continue to monitor and recheck CBG in 30 mins.

## 2020-11-04 NOTE — ED Triage Notes (Signed)
Pt comes from home via [facility] for hypoglycemia, CBG was 55, after eating dinner, given 25gm of D10.

## 2020-11-04 NOTE — ED Notes (Signed)
Pt CBG is now 104 after drinking orange juice and eating graham crackers. Will continue to monitor.

## 2020-11-04 NOTE — ED Provider Notes (Signed)
[facility] EMERGENCY DEPARTMENT Provider Note   CSN: [id number] Arrival date & time: 11/04/20  0109     History Chief Complaint  Patient presents with  . Hypoglycemia    Kayla Moyer is a 75 y.o. female.  Patient is a 75 year old female with a history of diabetes, hypertension who is presenting today due to weakness and altered mental status. Patient and her daughter give the history. Daughter reports that over the last 2 months or more patient has had a gradual decline. She is been having more falls at home, spending more time in bed, becoming forgetful. However yesterday daughter called EMS in the morning because she was concerned she was having a stroke. The patient's face was twisted she was having difficulty following commands and her speech was slurred. When EMS arrived patient had a below blood sugar hand after getting something to eat her sugar and symptoms improved. However the similar symptoms occurred again last night when EMS arrived blood sugar was 55 and that was right after having something to eat. She was given IV therapies and sent here for further evaluation. Patient reports that she has not been eating much lately because she does not like vegan food which her daughter has recently gone vegan in the last few months. Patient denies having any chest pain, shortness of breath, abdominal pain, vomiting or diarrhea. She has had no recent medication changes and has been on this diabetic therapy for a long period of time. She denies any recent fever. Daughter confirms these above facts. Currently patient has not been seen by her doctor since this decline in the last few months she has not seen any specialist and she is not getting any services in her home.  The history is provided by the patient and a caregiver.  Hypoglycemia Initial blood sugar:  55 Blood sugar after intervention:  145 Severity:  Moderate Onset quality:  Gradual Duration:  1 day Timing:   Intermittent Progression:  Worsening Chronicity:  Recurrent Diabetic status:  Controlled with oral medications Context: decreased oral intake and diet changes   Context: not recent illness and not treatment noncompliance   Relieved by:  Eating and IV glucose Ineffective treatments:  None tried Associated symptoms: altered mental status, decreased responsiveness and weakness        Past Medical History:  Diagnosis Date  . Arthritis   . Diabetes mellitus   . Gout   . Hypertension     There are no problems to display for this patient.   Past Surgical History:  Procedure Laterality Date  . BREAST EXCISIONAL BIOPSY Left 1994     OB History   No obstetric history on file.     No family history on file.  Social History   Tobacco Use  . Smoking status: Former Smoker  Substance Use Topics  . Alcohol use: No  . Drug use: No    Home Medications Prior to Admission medications   Medication Sig Start Date End Date Taking? Authorizing Provider  allopurinol (ZYLOPRIM) 100 MG tablet Take 100 mg by mouth daily.   Yes [provider]  amLODipine (NORVASC) 5 MG tablet Take 5 mg by mouth daily. 07/14/20  Yes [provider]  atenolol (TENORMIN) 100 MG tablet Take 100 mg by mouth daily.   Yes [provider]  colchicine 0.6 MG tablet Take 1-2 tablets (0.6-1.2 mg total) by mouth daily as needed (gout). Start with 1.2mg s at first sign of gout flare then the  next day if gout persists take 0.6mg  04/11/20  Yes Mesner, Barbara Cower, MD  glipiZIDE (GLUCOTROL) 10 MG tablet Take 10 mg by mouth 2 (two) times daily.    Yes [provider]  levothyroxine (SYNTHROID, LEVOTHROID) 50 MCG tablet Take 50 mcg by mouth daily before breakfast.   Yes [provider]  losartan (COZAAR) 50 MG tablet Take 50 mg by mouth daily. 07/14/20  Yes [provider]  pravastatin (PRAVACHOL) 20 MG tablet Take 20 mg by mouth every evening.   Yes [provider]   diclofenac Sodium (VOLTAREN) 1 % GEL Apply 2 g topically 4 (four) times daily. Patient not taking: No sig reported 08/02/20   Benjiman Core, MD  oxyCODONE-acetaminophen (PERCOCET) 5-325 MG tablet Take 2 tablets by mouth every 6 (six) hours as needed for severe pain. Patient not taking: No sig reported 04/11/20   Mesner, Barbara Cower, MD  polyethylene glycol-electrolytes (TRILYTE) 420 g solution Take 4,000 mLs by mouth as directed. Patient not taking: No sig reported 11/29/16   Rourk, Gerrit Friends, MD    Allergies    Lisinopril  Review of Systems   Review of Systems  Constitutional: Positive for decreased responsiveness.  Neurological: Positive for weakness.  All other systems reviewed and are negative.   Physical Exam Updated Vital Signs BP 128/84   Pulse 63   Temp 98.8 F (37.1 C) (Oral)   Resp (!) 21   SpO2 98%   Physical Exam Vitals and nursing note reviewed.  Constitutional:      General: She is not in acute distress.    Appearance: Normal appearance. She is well-developed and well-nourished.  HENT:     Head: Normocephalic and atraumatic.     Mouth/Throat:     Mouth: Mucous membranes are moist.  Eyes:     Extraocular Movements: EOM normal.     Pupils: Pupils are equal, round, and reactive to light.  Cardiovascular:     Rate and Rhythm: Normal rate and regular rhythm.     Pulses: Intact distal pulses.     Heart sounds: Normal heart sounds. No murmur heard. No friction rub.  Pulmonary:     Effort: Pulmonary effort is normal.     Breath sounds: Normal breath sounds. No wheezing or rales.  Abdominal:     General: Bowel sounds are normal. There is no distension.     Palpations: Abdomen is soft.     Tenderness: There is no abdominal tenderness. There is no guarding or rebound.  Musculoskeletal:        General: No tenderness. Normal range of motion.     Right lower leg: No edema.     Left lower leg: No edema.     Comments: No edema  Skin:    General: Skin is warm and dry.      Findings: No rash.  Neurological:     General: No focal deficit present.     Mental Status: She is alert and oriented to person, place, and time. Mental status is at baseline.     Cranial Nerves: No cranial nerve deficit.     Sensory: No sensory deficit.     Motor: No weakness.  Psychiatric:        Mood and Affect: Mood and affect normal.        Behavior: Behavior normal.     Comments: Quiet but answers all questions appropriately     ED Results / Procedures / Treatments   Labs (all labs ordered are listed, but  only abnormal results are displayed) Labs Reviewed  CBC - Abnormal; Notable for the following components:      Result Value   Hemoglobin 11.1 (*)    HCT 35.7 (*)    All other components within normal limits  BASIC METABOLIC PANEL - Abnormal; Notable for the following components:   Glucose, Bld 145 (*)    Creatinine, Ser 1.30 (*)    GFR, Estimated 43 (*)    All other components within normal limits  CBG MONITORING, ED - Abnormal; Notable for the following components:   Glucose-Capillary 125 (*)    All other components within normal limits  CBG MONITORING, ED - Abnormal; Notable for the following components:   Glucose-Capillary 60 (*)    All other components within normal limits  CBG MONITORING, ED - Abnormal; Notable for the following components:   Glucose-Capillary 104 (*)    All other components within normal limits  URINALYSIS, ROUTINE W REFLEX MICROSCOPIC    EKG None  Radiology No results found.  Procedures Procedures   Medications Ordered in ED Medications - No data to display  ED Course  I have reviewed the triage vital signs and the nursing notes.  Pertinent labs & imaging results that were available during my care of the patient were reviewed by me and considered in my medical decision making (see chart for details).    MDM Rules/Calculators/A&P                          Elderly female presenting today due to recurrent hypoglycemia that  occurred yesterday. Patient unfortunately waited for 7 hours prior to being seen. Initially after receiving sugar products by EMS blood sugar was 145 then she waited for 6 hours before it was checked again. On my exam patient is awake and alert in no acute distress but repeat blood sugar was 60. Patient did report that she would eat and drink something. Approximately 30 minutes later her blood sugar was 104. Patient denies any specific symptoms except that she has not been eating because she just doesn't feel hungry and doesn't like vegan food. However speaking with patient's daughter she reports a decline over the last 2 months where patient has had multiple falls been generally weaker and having memory issues. She has not been evaluated for this. This could be decline based on age and medical problems however also with her several falls could be small subdural hemorrhages or stroke. She is not displaying any acute stroke symptoms at this time. Patient's labs are reassuring with a normal CBC, unchanged CMP. We'll check a urine and a head CT.  11:47 AM Head CT is negative for acute findings.  Urine also within normal limits.  Patient's blood sugar has been monitored and has remained greater than 100.  She currently still has no complaints.  Will discharge patient home with outpatient services.  Also given an ambulatory referral for neurology.  Findings discussed with her daughter who is comfortable with this plan.  MDM Number of Diagnoses or Management Options   Amount and/or Complexity of Data Reviewed Clinical lab tests: ordered and reviewed Tests in the radiology section of CPT: ordered and reviewed Tests in the medicine section of CPT: ordered and reviewed Decide to obtain previous medical records or to obtain history from someone other than the patient: yes Obtain history from someone other than the patient: yes Review and summarize past medical records: yes Independent visualization of images,  tracings, or  specimens: yes  Risk of Complications, Morbidity, and/or Mortality Presenting problems: moderate Diagnostic procedures: low Management options: low  Patient Progress Patient progress: improved   Final Clinical Impression(s) / ED Diagnoses Final diagnoses:  Hypoglycemia    Rx / DC Orders ED Discharge Orders    None       Gwyneth Sprout, MD 11/04/20 1151

## 2020-11-04 NOTE — Progress Notes (Signed)
   11/04/20 1042  TOC ED Mini Assessment  TOC Time spent with patient (minutes): 60  PING Used in TOC Assessment Yes  Admission or Readmission Diverted Yes  Interventions which prevented an admission or readmission Medication Review;Home Health Consult or Services  What brought you to the Emergency Department?  weakness  Means of departure Car  CMS Medicare.gov Compare Post Acute Care list provided to: Patient  Choice offered to / list presented to  Patient    Kayla Moyer J. Lucretia Roers, RN, BSN, [place] [phone] RNCM spoke with pt at bedside regarding discharge planning for Home Health Services. Offered pt medicare.gov list of home health agencies to choose from.  Pt chose [facility] to render services. [place] [facility] notified. Patient made aware that [facility] will be in contact in 24-48 hours.  No DME needs identified at this time.

## 2021-05-20 ENCOUNTER — Other Ambulatory Visit (HOSPITAL_COMMUNITY): Payer: Self-pay | Admitting: Nephrology

## 2021-05-20 ENCOUNTER — Other Ambulatory Visit (HOSPITAL_BASED_OUTPATIENT_CLINIC_OR_DEPARTMENT_OTHER): Payer: Self-pay | Admitting: Nephrology

## 2021-05-20 DIAGNOSIS — E1122 Type 2 diabetes mellitus with diabetic chronic kidney disease: Secondary | ICD-10-CM

## 2021-05-20 DIAGNOSIS — D638 Anemia in other chronic diseases classified elsewhere: Secondary | ICD-10-CM

## 2021-05-20 DIAGNOSIS — I5032 Chronic diastolic (congestive) heart failure: Secondary | ICD-10-CM

## 2021-05-20 DIAGNOSIS — E1129 Type 2 diabetes mellitus with other diabetic kidney complication: Secondary | ICD-10-CM

## 2021-05-20 DIAGNOSIS — R809 Proteinuria, unspecified: Secondary | ICD-10-CM

## 2021-05-20 DIAGNOSIS — N2581 Secondary hyperparathyroidism of renal origin: Secondary | ICD-10-CM

## 2021-05-28 ENCOUNTER — Ambulatory Visit (HOSPITAL_BASED_OUTPATIENT_CLINIC_OR_DEPARTMENT_OTHER)
Admission: RE | Admit: 2021-05-28 | Discharge: 2021-05-28 | Disposition: A | Discharge status: Home / Self Care | Payer: Medicare Other | Source: Ambulatory Visit | Attending: Nephrology | Admitting: Nephrology

## 2021-05-28 ENCOUNTER — Other Ambulatory Visit: Payer: Self-pay

## 2021-05-28 DIAGNOSIS — D638 Anemia in other chronic diseases classified elsewhere: Secondary | ICD-10-CM | POA: Diagnosis present

## 2021-05-28 DIAGNOSIS — E1122 Type 2 diabetes mellitus with diabetic chronic kidney disease: Secondary | ICD-10-CM | POA: Insufficient documentation

## 2021-05-28 DIAGNOSIS — E1129 Type 2 diabetes mellitus with other diabetic kidney complication: Secondary | ICD-10-CM | POA: Diagnosis present

## 2021-05-28 DIAGNOSIS — I5032 Chronic diastolic (congestive) heart failure: Secondary | ICD-10-CM | POA: Diagnosis present

## 2021-05-28 DIAGNOSIS — R809 Proteinuria, unspecified: Secondary | ICD-10-CM | POA: Insufficient documentation

## 2021-05-28 DIAGNOSIS — N2581 Secondary hyperparathyroidism of renal origin: Secondary | ICD-10-CM | POA: Insufficient documentation

## 2022-03-17 ENCOUNTER — Other Ambulatory Visit: Payer: Self-pay | Admitting: Nephrology

## 2022-03-17 ENCOUNTER — Other Ambulatory Visit (HOSPITAL_BASED_OUTPATIENT_CLINIC_OR_DEPARTMENT_OTHER): Payer: Self-pay | Admitting: Nephrology

## 2022-03-17 DIAGNOSIS — E1129 Type 2 diabetes mellitus with other diabetic kidney complication: Secondary | ICD-10-CM

## 2022-03-17 DIAGNOSIS — E1122 Type 2 diabetes mellitus with diabetic chronic kidney disease: Secondary | ICD-10-CM

## 2022-03-21 ENCOUNTER — Ambulatory Visit (HOSPITAL_BASED_OUTPATIENT_CLINIC_OR_DEPARTMENT_OTHER)
Admission: RE | Admit: 2022-03-21 | Discharge: 2022-03-21 | Disposition: A | Discharge status: Home / Self Care | Payer: Medicare (Managed Care) | Source: Ambulatory Visit | Attending: Nephrology | Admitting: Nephrology

## 2022-03-21 DIAGNOSIS — E1122 Type 2 diabetes mellitus with diabetic chronic kidney disease: Secondary | ICD-10-CM | POA: Insufficient documentation

## 2022-03-21 DIAGNOSIS — R809 Proteinuria, unspecified: Secondary | ICD-10-CM | POA: Diagnosis present

## 2022-03-21 DIAGNOSIS — E1129 Type 2 diabetes mellitus with other diabetic kidney complication: Secondary | ICD-10-CM | POA: Insufficient documentation

## 2022-08-17 IMAGING — CT CT HEAD W/O CM
3 series · 16 of 47 positions shown, 19 images · non-contrast
Comparison: Head CT 08/02/2020.

CLINICAL DATA: 75-year-old female with unexplained altered mental
status.

EXAM:
CT HEAD WITHOUT CONTRAST
TECHNIQUE: Contiguous axial images were obtained from the base of the skull
through the vertex without intravenous contrast.

[Series 4: head 5.0 h30s · axial · 0.47mm/px · z∈[-121,+4]mm · 10 of 31 slices shown, 13 images]
[im 3/31  brain]
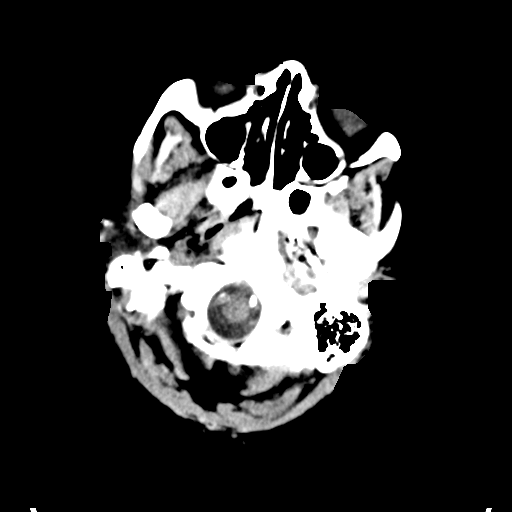
[im 3/31  bone]
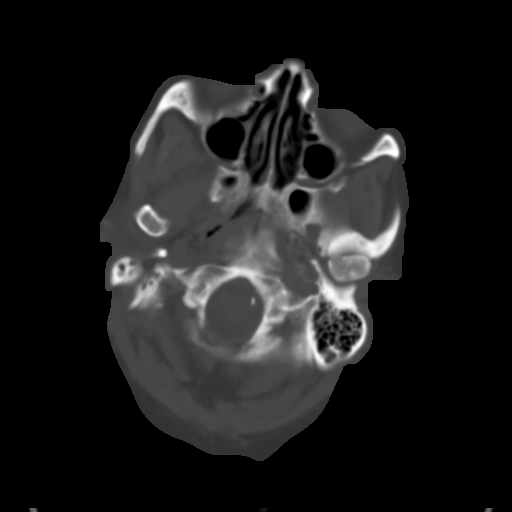
[im 6/31  brain]
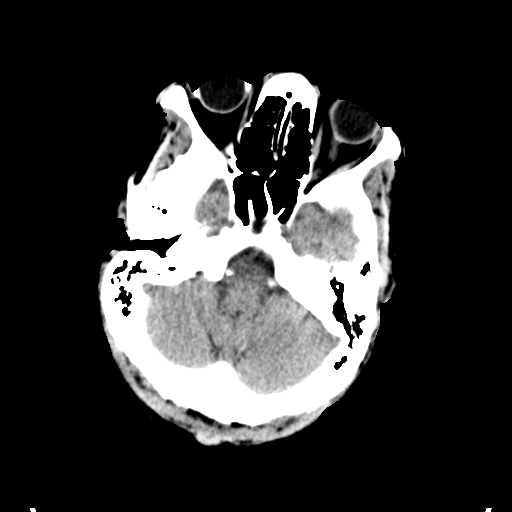
[im 9/31  brain]
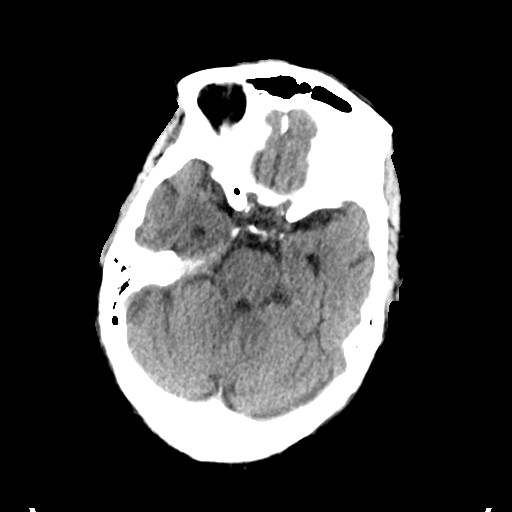
[im 11/31  brain]
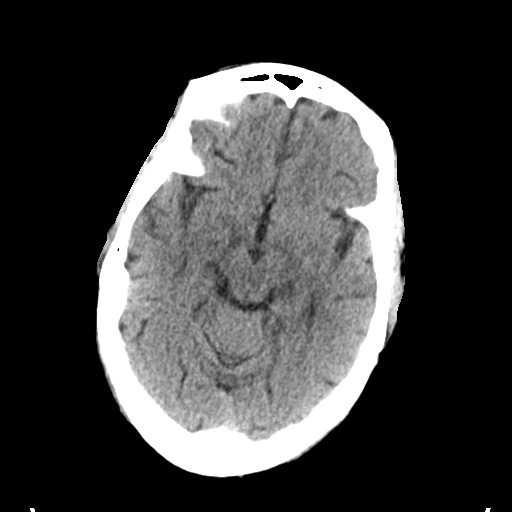
[im 14/31  brain]
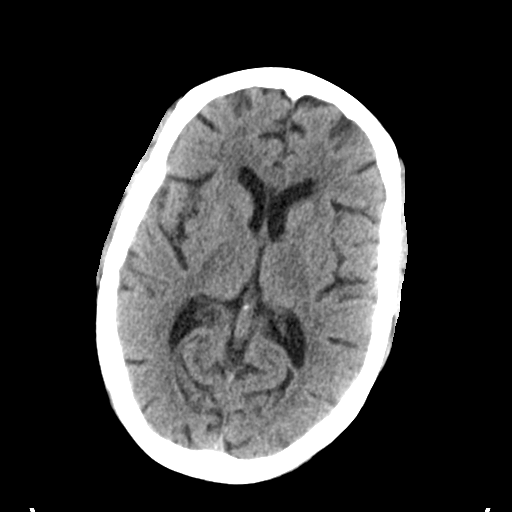
[im 14/31  bone]
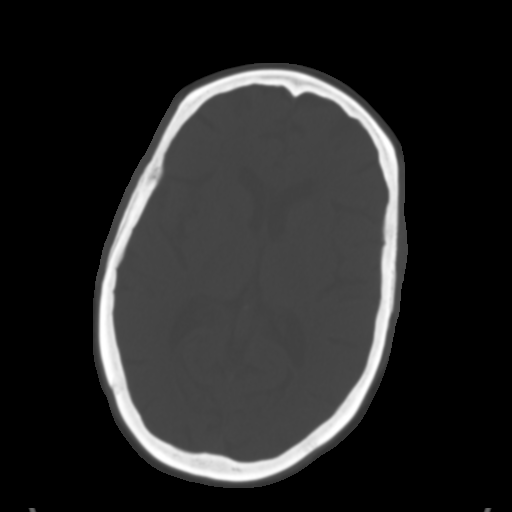
[im 17/31  brain]
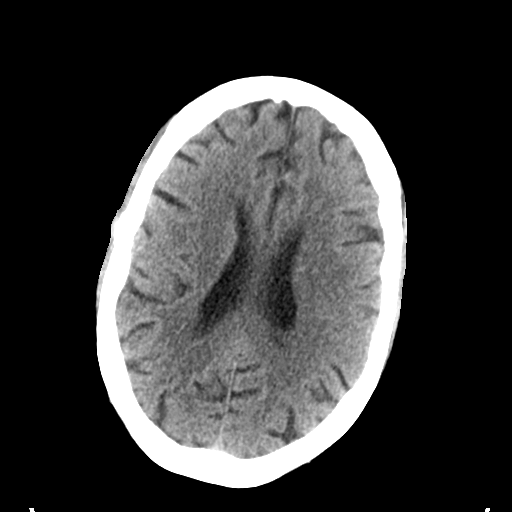
[im 20/31  brain]
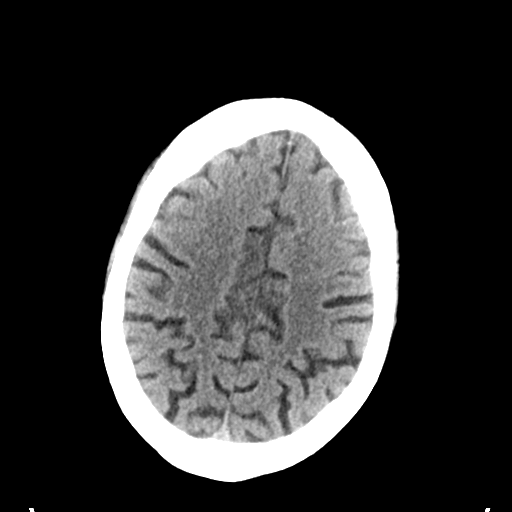
[im 23/31  brain]
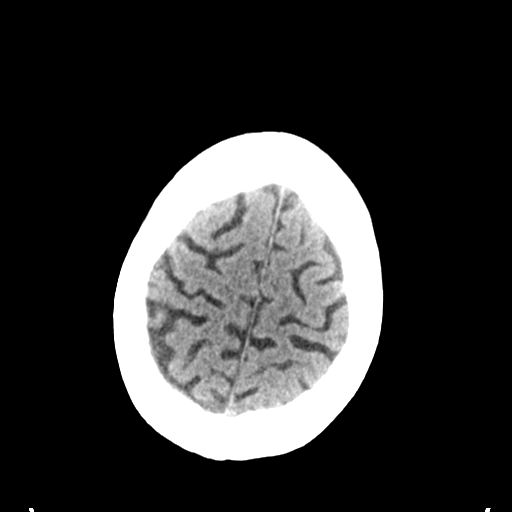
[im 25/31  brain]
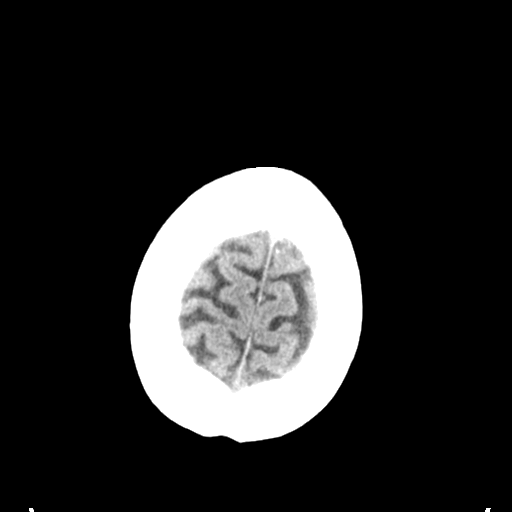
[im 25/31  bone]
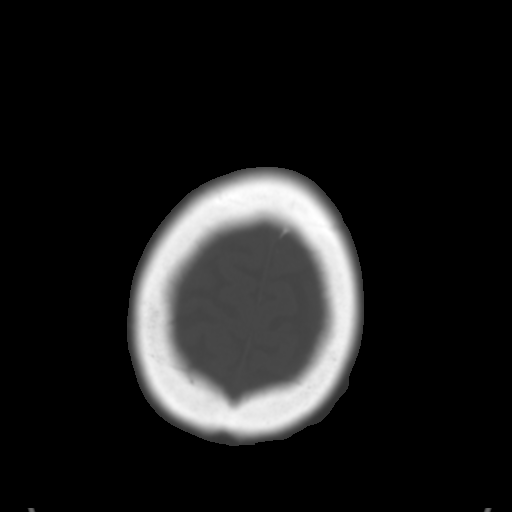
[im 28/31  brain]
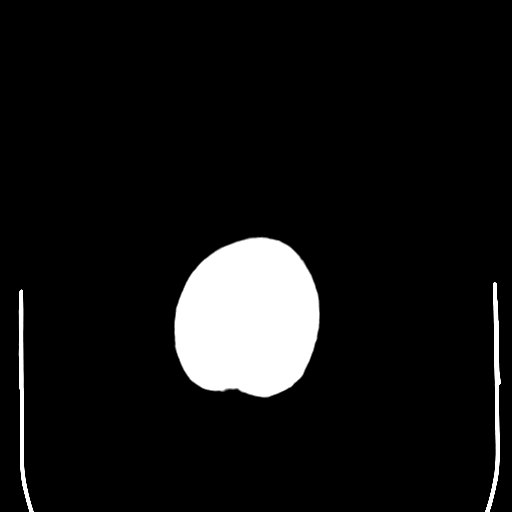

[Series 5: head 3.0 mpr cor · coronal · 0.35mm/px · 3 of 70 slices shown]
[im 24/70  brain]
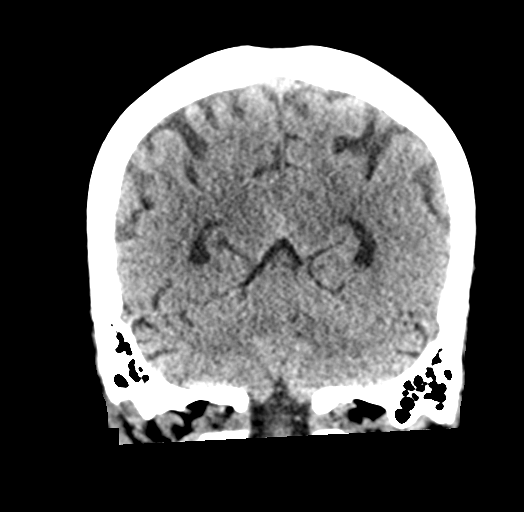
[im 31/70  brain]
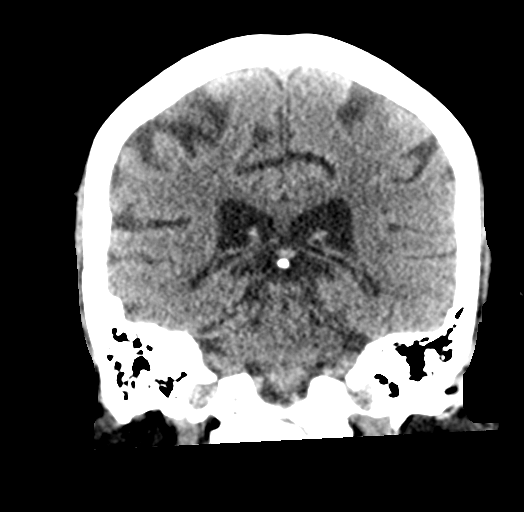
[im 39/70  brain]
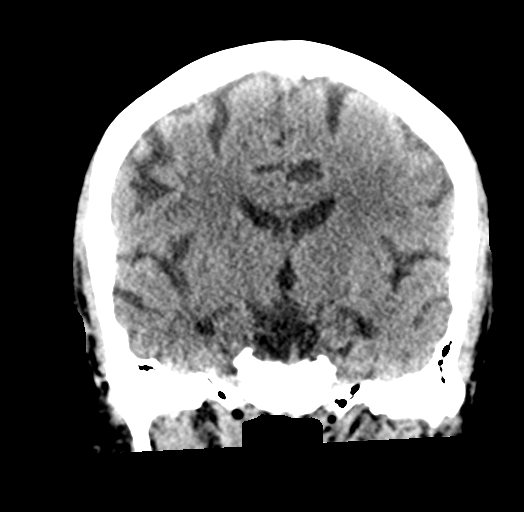

[Series 6: head 3.0 mpr sag · sagittal · 0.35mm/px · 3 of 54 slices shown]
[im 18/54  brain]
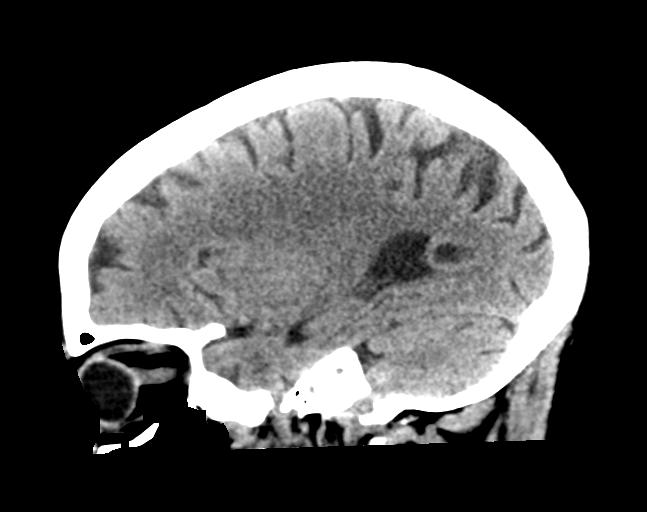
[im 27/54  brain]
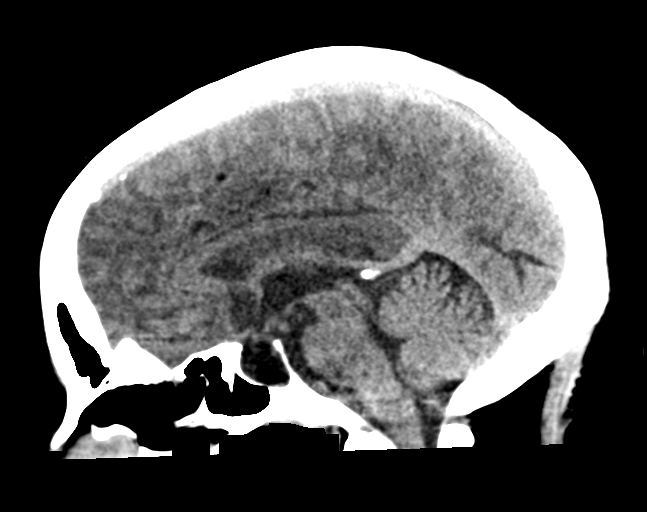
[im 36/54  brain]
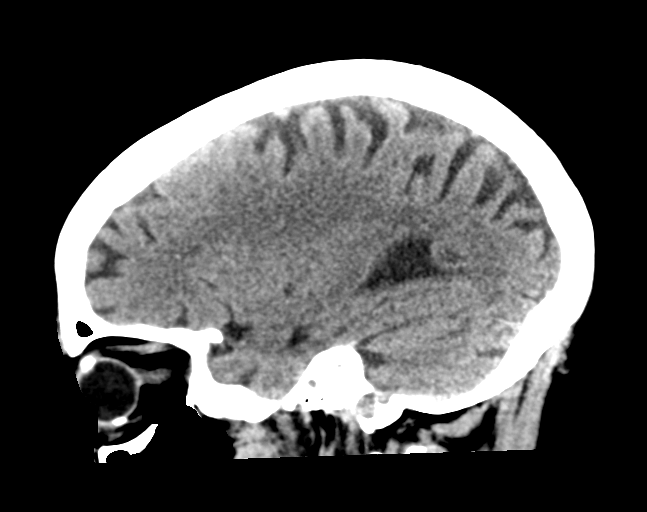

[16 of 47 positions shown; findings below may reference images not displayed]

FINDINGS: Brain: Overall cerebral volume is stable and normal for age. Chronic
partially empty sella. No midline shift, ventriculomegaly, mass
effect, evidence of mass lesion, intracranial hemorrhage or evidence
of cortically based acute infarction. Questionable small area of
cortical encephalomalacia along the posterior left operculum on
series 4, image 19 is stable. Elsewhere normal for age gray-white
matter differentiation.

Vascular: Calcified atherosclerosis at the skull base. No suspicious
intracranial vascular hyperdensity.

Skull: No acute osseous abnormality identified.

Sinuses/Orbits: Visualized paranasal sinuses and mastoids are clear.

Other: Stable orbit and scalp soft tissues.
IMPRESSION: Stable and largely normal for age non contrast CT appearance of the
brain.

## 2023-03-10 IMAGING — US US RENAL
1 series · 14 of 25 positions shown · non-contrast
Comparison: None.

CLINICAL DATA: CKD, diabetes

EXAM:
RENAL / URINARY TRACT ULTRASOUND COMPLETE

[Series 1: us renal · 55 acquisitions, 14 frames shown]
[im 1/55]
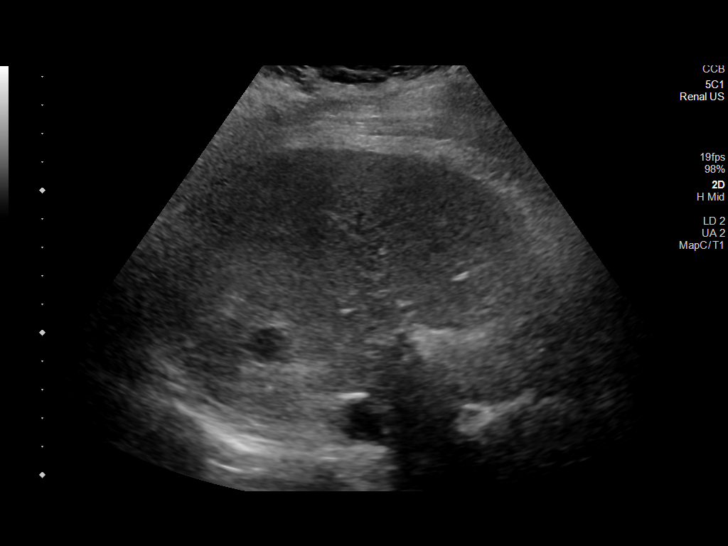
[im 5/55]
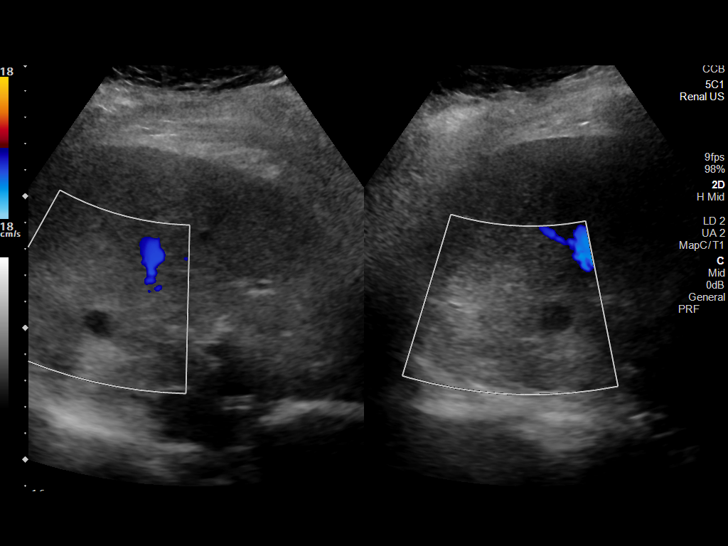
[im 10/55]
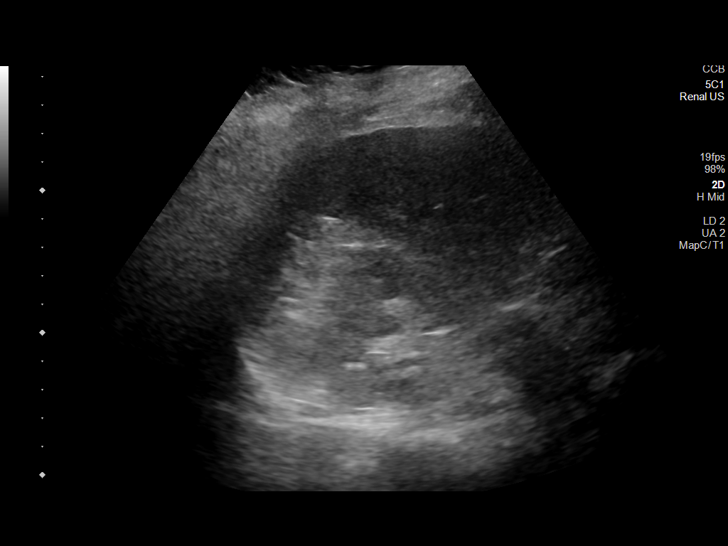
[im 14/55]
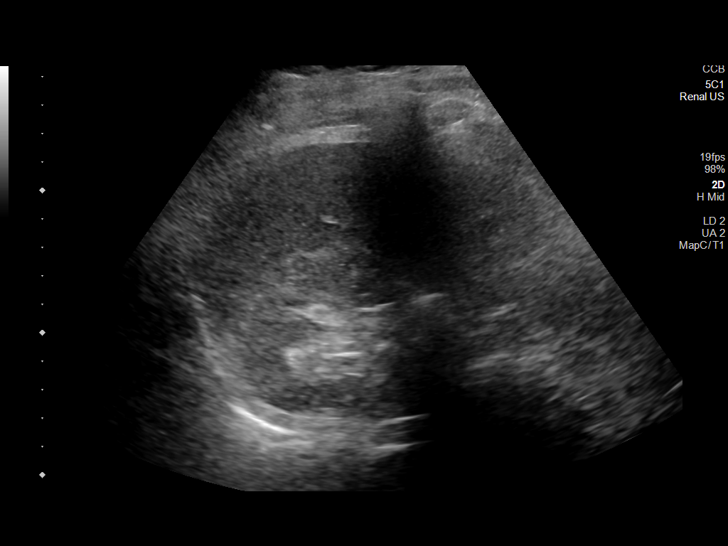
[im 19/55]
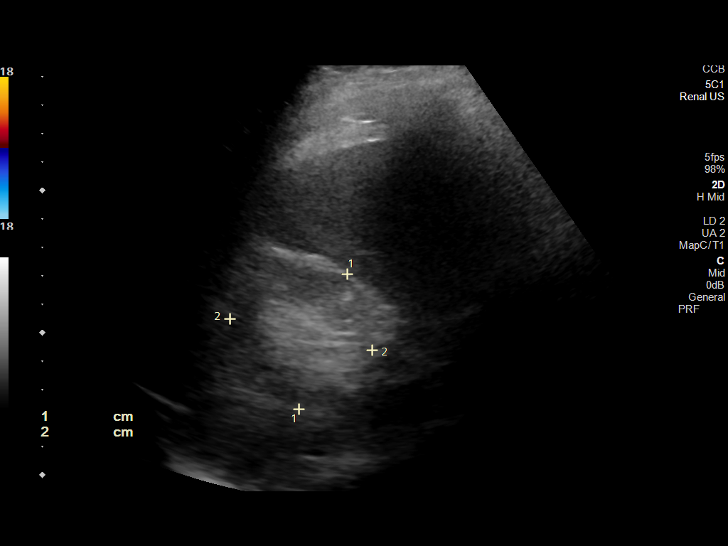
[im 21/55]
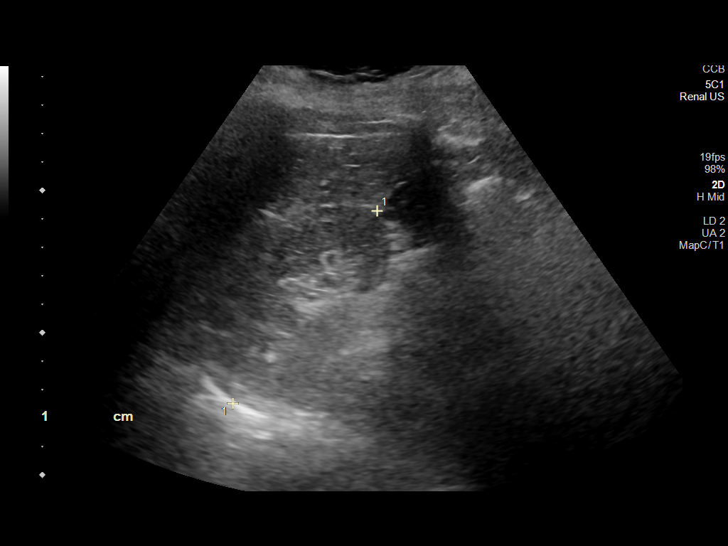
[im 25/55]
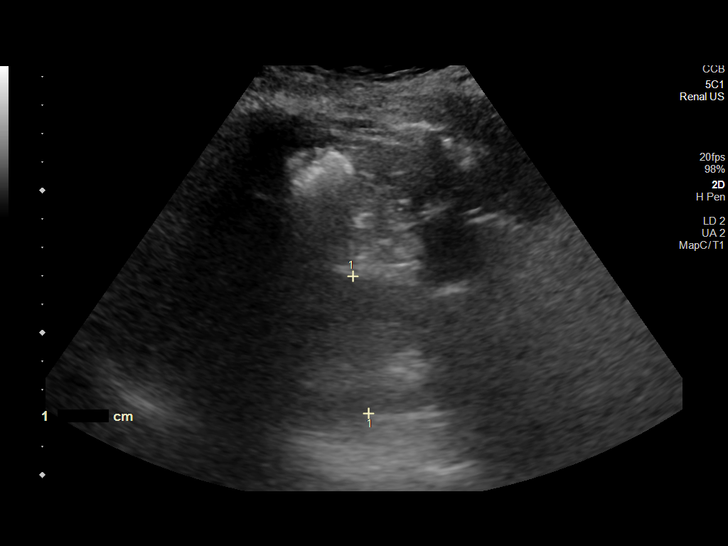
[im 30/55]
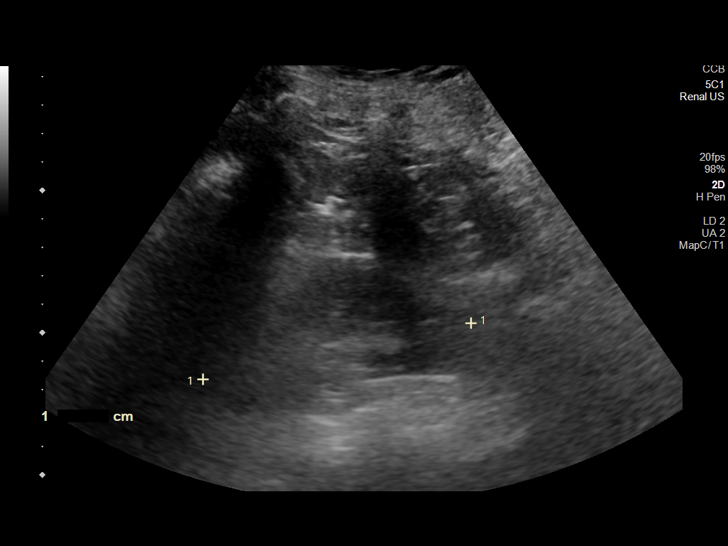
[im 34/55]
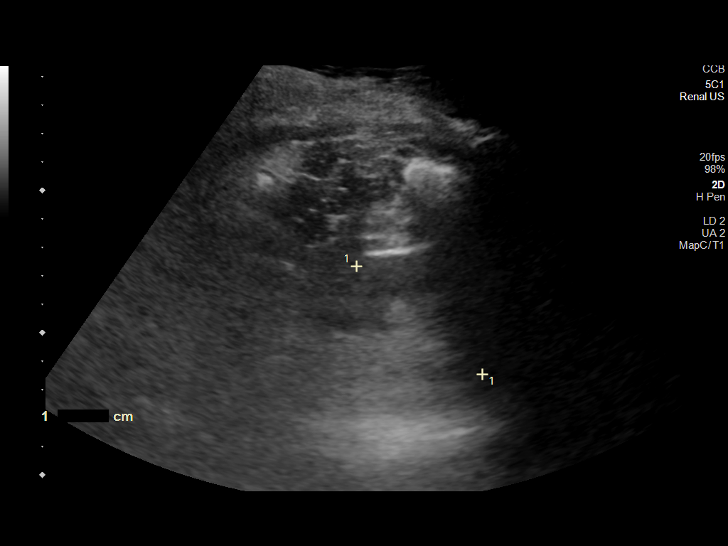
[im 37/55]
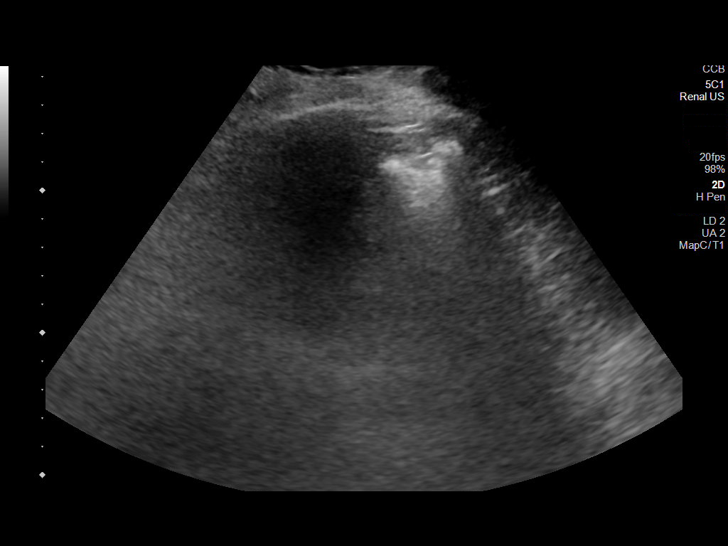
[im 41/55]
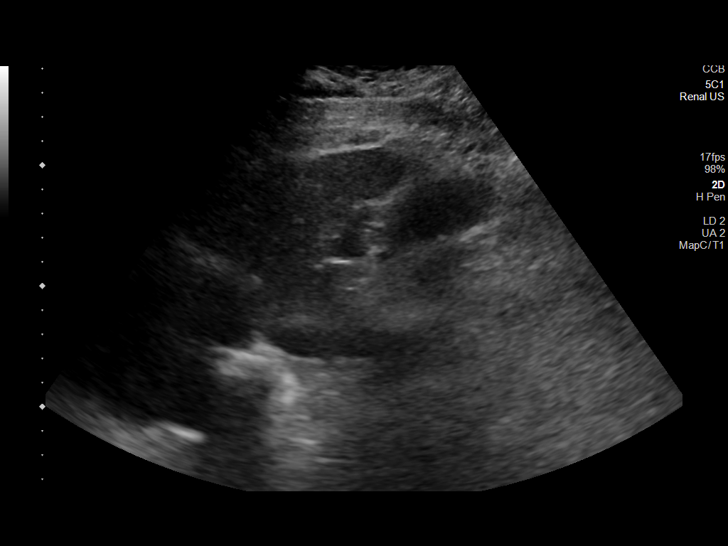
[im 46/55]
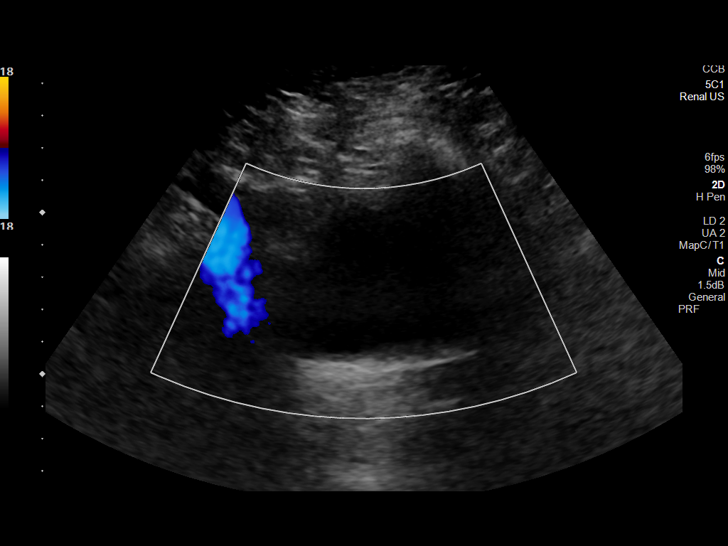
[im 50/55]
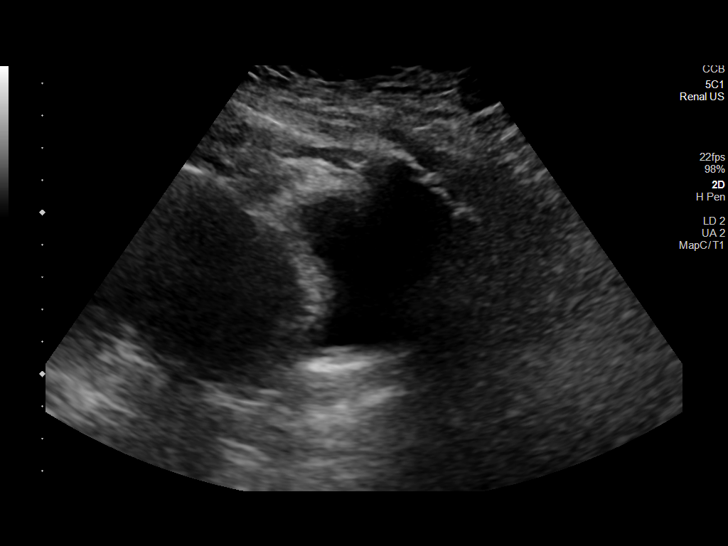
[im 55/55]
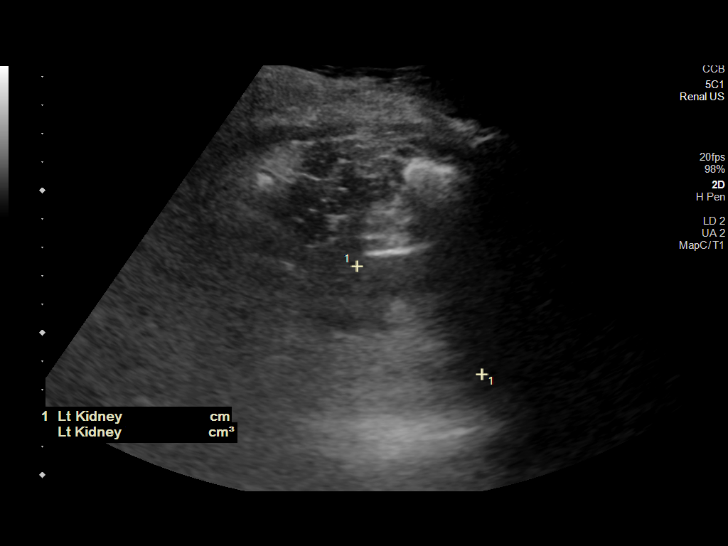

[14 of 25 positions shown; findings below may reference images not displayed]

FINDINGS: Right Kidney:

Renal measurements: 8.0 x 5.0 x 5.1 cm = volume: 107 mL.
Echogenicity within normal limits. No hydronephrosis. There is a
small cyst measuring 1.1 x 1.3 x 1.1 cm, difficult to fully
characterize.

Left Kidney:

Not well visualized. Estimated renal measurements: 9.6 x 4.6 x
cm = volume: 134 mL. Echogenicity within normal limits. No mass or
hydronephrosis visualized.

Bladder:

Appears normal for degree of bladder distention.

Other:

None.
IMPRESSION: Exam is extremely limited by body habitus and shadowing bowel gas.
Right renal cyst, not well characterized. No definite acute finding.

## 2023-12-25 ENCOUNTER — Other Ambulatory Visit (HOSPITAL_COMMUNITY)
Admission: RE | Admit: 2023-12-25 | Discharge: 2023-12-25 | Disposition: A | Discharge status: Home / Self Care | Source: Ambulatory Visit | Attending: Nephrology | Admitting: Nephrology

## 2023-12-25 DIAGNOSIS — D631 Anemia in chronic kidney disease: Secondary | ICD-10-CM | POA: Insufficient documentation

## 2023-12-25 DIAGNOSIS — N189 Chronic kidney disease, unspecified: Secondary | ICD-10-CM | POA: Diagnosis present

## 2023-12-25 DIAGNOSIS — E211 Secondary hyperparathyroidism, not elsewhere classified: Secondary | ICD-10-CM | POA: Insufficient documentation

## 2023-12-25 DIAGNOSIS — R809 Proteinuria, unspecified: Secondary | ICD-10-CM | POA: Insufficient documentation

## 2023-12-25 LAB — RENAL FUNCTION PANEL
Albumin: 3.1 g/dL — ABNORMAL LOW (ref 3.5–5.0)
Anion gap: 12 (ref 5–15)
BUN: 47 mg/dL — ABNORMAL HIGH (ref 8–23)
CO2: 23 mmol/L (ref 22–32)
Calcium: 9.8 mg/dL (ref 8.9–10.3)
Chloride: 101 mmol/L (ref 98–111)
Creatinine, Ser: 1.66 mg/dL — ABNORMAL HIGH (ref 0.44–1.00)
GFR, Estimated: 31 mL/min — ABNORMAL LOW (ref >60)
Glucose, Bld: 148 mg/dL — ABNORMAL HIGH (ref 70–99)
Phosphorus: 2.9 mg/dL (ref 2.5–4.6)
Potassium: 4.5 mmol/L (ref 3.5–5.1)
Sodium: 136 mmol/L (ref 135–145)

## 2023-12-25 LAB — CBC
HCT: 37 % (ref 36.0–46.0)
Hemoglobin: 11.2 g/dL — ABNORMAL LOW (ref 12.0–15.0)
MCH: 25.7 pg — ABNORMAL LOW (ref 26.0–34.0)
MCHC: 30.3 g/dL (ref 30.0–36.0)
MCV: 84.9 fL (ref 80.0–100.0)
Platelets: 417 10*3/uL — ABNORMAL HIGH (ref 150–400)
RBC: 4.36 MIL/uL (ref 3.87–5.11)
RDW: 14.5 % (ref 11.5–15.5)
WBC: 6.1 10*3/uL (ref 4.0–10.5)
nRBC: 0 % (ref 0.0–0.2)

## 2023-12-25 LAB — PROTEIN / CREATININE RATIO, URINE
Creatinine, Urine: 149 mg/dL
Protein Creatinine Ratio: 0.21 mg/mg{creat} — ABNORMAL HIGH (ref 0.00–0.15)
Total Protein, Urine: 31 mg/dL

## 2023-12-26 LAB — PARATHYROID HORMONE, INTACT (NO CA): PTH: 90 pg/mL — ABNORMAL HIGH (ref 15–65)
